# Patient Record
Sex: Male | Born: 1960 | Race: White | Hispanic: No | Marital: Married | Smoking: Never smoker
Health system: Southern US, Community
[De-identification: ages and names within clinical notes are randomized; demographics above are authoritative.]

## PROBLEM LIST (undated history)

## (undated) DIAGNOSIS — J45909 Unspecified asthma, uncomplicated: Secondary | ICD-10-CM

## (undated) DIAGNOSIS — F32A Depression, unspecified: Secondary | ICD-10-CM

## (undated) DIAGNOSIS — H409 Unspecified glaucoma: Secondary | ICD-10-CM

## (undated) HISTORY — DX: Unspecified glaucoma: H40.9

## (undated) HISTORY — DX: Unspecified asthma, uncomplicated: J45.909

## (undated) HISTORY — DX: Depression, unspecified: F32.A

## (undated) HISTORY — PX: SPINE SURGERY: SHX786

---

## 2016-09-28 DIAGNOSIS — S99921S Unspecified injury of right foot, sequela: Secondary | ICD-10-CM | POA: Insufficient documentation

## 2016-09-28 DIAGNOSIS — M7741 Metatarsalgia, right foot: Secondary | ICD-10-CM | POA: Insufficient documentation

## 2017-09-29 LAB — CBC AND DIFFERENTIAL
HCT: 43 (ref 41–53)
Hemoglobin: 14.4 (ref 13.5–17.5)
Platelets: 230 (ref 150–399)
WBC: 6

## 2017-09-29 LAB — LIPID PANEL
Cholesterol: 220 — AB (ref 0–200)
HDL: 47 (ref 35–70)
LDL Cholesterol: 150
Triglycerides: 117 (ref 40–160)

## 2017-09-29 LAB — BASIC METABOLIC PANEL
BUN: 18 (ref 4–21)
Creatinine: 0.7 (ref <=1.3)
Glucose: 100
Potassium: 4.2 (ref 3.4–5.3)
Sodium: 140 (ref 137–147)

## 2017-09-29 LAB — HEPATIC FUNCTION PANEL
ALT: 28 (ref 10–40)
AST: 28 (ref 14–40)
Alkaline Phosphatase: 66 (ref 25–125)
Bilirubin, Total: 0.5

## 2017-09-29 LAB — TSH: TSH: 0.98 (ref <=5.90)

## 2017-09-29 LAB — PSA: PSA: 3.5

## 2017-11-20 LAB — HM COLONOSCOPY

## 2018-08-23 DIAGNOSIS — H2513 Age-related nuclear cataract, bilateral: Secondary | ICD-10-CM | POA: Diagnosis not present

## 2018-08-23 DIAGNOSIS — H35362 Drusen (degenerative) of macula, left eye: Secondary | ICD-10-CM | POA: Diagnosis not present

## 2018-08-23 DIAGNOSIS — I7090 Unspecified atherosclerosis: Secondary | ICD-10-CM | POA: Diagnosis not present

## 2018-08-23 DIAGNOSIS — H401131 Primary open-angle glaucoma, bilateral, mild stage: Secondary | ICD-10-CM | POA: Diagnosis not present

## 2018-10-27 ENCOUNTER — Encounter: Payer: Self-pay | Admitting: Adult Health

## 2018-10-27 ENCOUNTER — Ambulatory Visit (INDEPENDENT_AMBULATORY_CARE_PROVIDER_SITE_OTHER): Payer: BLUE CROSS/BLUE SHIELD | Admitting: Adult Health

## 2018-10-27 ENCOUNTER — Other Ambulatory Visit: Payer: Self-pay

## 2018-10-27 DIAGNOSIS — H409 Unspecified glaucoma: Secondary | ICD-10-CM

## 2018-10-27 DIAGNOSIS — L719 Rosacea, unspecified: Secondary | ICD-10-CM

## 2018-10-27 DIAGNOSIS — Z0000 Encounter for general adult medical examination without abnormal findings: Secondary | ICD-10-CM

## 2018-10-27 MED ORDER — METRONIDAZOLE 0.75 % EX GEL: 1.0000 "application " | Freq: Two times a day (BID) | CUTANEOUS | 2 refills | Status: AC

## 2018-10-27 NOTE — Assessment & Plan Note (Signed)
Glaucoma, dx'd 2019- followed by local Ophthalmologist Q3M, next OV 22 November 2018

## 2018-10-27 NOTE — Progress Notes (Signed)
Virtual Visit via Video Note  I connected with Allen Castillo on 10/27/18 at  2:30 PM EDT by a video enabled telemedicine application and verified that I am speaking with the correct person using two identifiers.  Location: Patient: Home Provider: In Clinic    I discussed the limitations of evaluation and management by telemedicine and the availability of in person appointments. The patient expressed understanding and agreed to proceed.  History of Present Illness: Mr. Allen Castillo calls in today to establish as a new pt.  He is a pleasant 58 year old male. [id number]- treated with metronidazole 0.75% gel,  Glaucoma, dx'd 2019- followed by local Ophthalmologist Q3M, next OV 22 November 2018 He denies acute change in vision. He reports regular exercise- daily walking >1.82miles/day, home exercise program- "[employer]". He eliminated soda and fast food >9 months ago He reports following heart healthy diet He denies tobacco/vape/ETOH use He estimates to drink >100 oz water/day He reports sound sleep He denies acute issues today  Review of Systems: General:   No F/C, wt loss Pulm:   No DIB, SOB, pleuritic chest pain Card:  No CP, palpitations Abd:  No n/v/d or pain Ext:  No inc edema from baseline This patient does not have sx concerning for COVID-19 Infection (ie; fever, chills, cough, new or worsening shortness of breath).   Observations/Objective: No acute distress noted during telephone conversation  Assessment and Plan: Continue regular exercise and heart healthy diet Continue f/u with Ophthalmologist   COVID-19 Education: Signs and symptoms of COVID-19 infection were discussed with pt and how to seek care for testing.  The importance of following the Stay at Home order, and when out- Social Distancing and wearing a facial mask were discussed today.  Follow Up Instructions: CPE in 3 months, fasting labs the week prior    I discussed the assessment and treatment plan with the  patient. The patient was provided an opportunity to ask questions and all were answered. The patient agreed with the plan and demonstrated an understanding of the instructions.   The patient was advised to call back or seek an in-person evaluation if the symptoms worsen or if the condition fails to improve as anticipated.  I provided 18 minutes of non-face-to-face time during this encounter.   Julaine Fusi, NP

## 2018-10-27 NOTE — Assessment & Plan Note (Signed)
Metronidazole 0.75% gel refill sent in

## 2018-10-27 NOTE — Assessment & Plan Note (Signed)
Assessment and Plan: Continue regular exercise and heart healthy diet Continue f/u with Ophthalmologist   COVID-19 Education: Signs and symptoms of COVID-19 infection were discussed with pt and how to seek care for testing.  The importance of following the Stay at Home order, and when out- Social Distancing and wearing a facial mask were discussed today.  Follow Up Instructions: CPE in 3 months, fasting labs the week prior    I discussed the assessment and treatment plan with the patient. The patient was provided an opportunity to ask questions and all were answered. The patient agreed with the plan and demonstrated an understanding of the instructions.   The patient was advised to call back or seek an in-person evaluation if the symptoms worsen or if the condition fails to improve as anticipated.

## 2018-11-22 DIAGNOSIS — H04123 Dry eye syndrome of bilateral lacrimal glands: Secondary | ICD-10-CM | POA: Diagnosis not present

## 2018-11-22 DIAGNOSIS — H1851 Endothelial corneal dystrophy: Secondary | ICD-10-CM | POA: Diagnosis not present

## 2018-11-22 DIAGNOSIS — H401131 Primary open-angle glaucoma, bilateral, mild stage: Secondary | ICD-10-CM | POA: Diagnosis not present

## 2019-02-14 ENCOUNTER — Other Ambulatory Visit: Payer: Self-pay

## 2019-02-14 DIAGNOSIS — Z0000 Encounter for general adult medical examination without abnormal findings: Secondary | ICD-10-CM | POA: Diagnosis not present

## 2019-02-15 LAB — COMPREHENSIVE METABOLIC PANEL
ALT: 27 IU/L (ref 0–44)
AST: 21 IU/L (ref 0–40)
Albumin/Globulin Ratio: 1.5 (ref 1.2–2.2)
Albumin: 4 g/dL (ref 3.8–4.9)
Alkaline Phosphatase: 84 IU/L (ref 39–117)
BUN/Creatinine Ratio: 14 (ref 9–20)
BUN: 14 mg/dL (ref 6–24)
Bilirubin Total: 0.4 mg/dL (ref 0.0–1.2)
CO2: 24 mmol/L (ref 20–29)
Calcium: 9.4 mg/dL (ref 8.7–10.2)
Chloride: 105 mmol/L (ref 96–106)
Creatinine, Ser: 0.97 mg/dL (ref 0.76–1.27)
GFR calc Af Amer: 98 mL/min/{1.73_m2} (ref >59)
GFR calc non Af Amer: 85 mL/min/{1.73_m2} (ref >59)
Globulin, Total: 2.6 g/dL (ref 1.5–4.5)
Glucose: 103 mg/dL — ABNORMAL HIGH (ref 65–99)
Potassium: 4.7 mmol/L (ref 3.5–5.2)
Sodium: 140 mmol/L (ref 134–144)
Total Protein: 6.6 g/dL (ref 6.0–8.5)

## 2019-02-15 LAB — LIPID PANEL
Chol/HDL Ratio: 5.2 ratio — ABNORMAL HIGH (ref 0.0–5.0)
Cholesterol, Total: 202 mg/dL — ABNORMAL HIGH (ref 100–199)
HDL: 39 mg/dL — ABNORMAL LOW (ref >39)
LDL Calculated: 124 mg/dL — ABNORMAL HIGH (ref 0–99)
Triglycerides: 193 mg/dL — ABNORMAL HIGH (ref 0–149)
VLDL Cholesterol Cal: 39 mg/dL (ref 5–40)

## 2019-02-15 LAB — CBC WITH DIFFERENTIAL/PLATELET
Basophils Absolute: 0.1 10*3/uL (ref 0.0–0.2)
Basos: 1 %
EOS (ABSOLUTE): 0.2 10*3/uL (ref 0.0–0.4)
Eos: 3 %
Hematocrit: 41.6 % (ref 37.5–51.0)
Hemoglobin: 14.4 g/dL (ref 13.0–17.7)
Immature Grans (Abs): 0 10*3/uL (ref 0.0–0.1)
Immature Granulocytes: 0 %
Lymphocytes Absolute: 1.6 10*3/uL (ref 0.7–3.1)
Lymphs: 28 %
MCH: 29.6 pg (ref 26.6–33.0)
MCHC: 34.6 g/dL (ref 31.5–35.7)
MCV: 86 fL (ref 79–97)
Monocytes Absolute: 0.4 10*3/uL (ref 0.1–0.9)
Monocytes: 7 %
Neutrophils Absolute: 3.5 10*3/uL (ref 1.4–7.0)
Neutrophils: 61 %
Platelets: 226 10*3/uL (ref 150–450)
RBC: 4.86 x10E6/uL (ref 4.14–5.80)
RDW: 12.1 % (ref 11.6–15.4)
WBC: 5.8 10*3/uL (ref 3.4–10.8)

## 2019-02-15 LAB — HEMOGLOBIN A1C
Est. average glucose Bld gHb Est-mCnc: 103 mg/dL
Hgb A1c MFr Bld: 5.2 % (ref 4.8–5.6)

## 2019-02-15 LAB — TSH: TSH: 0.91 u[IU]/mL (ref 0.450–4.500)

## 2019-02-21 NOTE — Progress Notes (Signed)
Subjective:    Patient ID: Allen Castillo, male    DOB: 01/04/1961, 58 y.o.   MRN: [id number]  HPI:  10/27/2018 OV: Allen Castillo calls in today to establish as a new pt.  He is a pleasant 57 year old male. [id number]- treated with metronidazole 0.75% gel,  Glaucoma, dx'd 2019- followed by local Ophthalmologist Q3M, next OV 22 November 2018 He denies acute change in vision. He reports regular exercise- daily walking >1.53miles/day, home exercise program- "[employer]". He eliminated soda and fast food >9 months ago He reports following heart healthy diet He denies tobacco/vape/ETOH use He estimates to drink >100 oz water/day He reports sound sleep He denies acute issues today 02/22/2019 OV: Allen Castillo is here for CPE He continues to exercise regularly and drinks >100 oz water/day He denies every being told about elevated BP First reading 151/85, HR 58 Repeat 145/86 He denies tobacco/vape/ETOH use He denies CP/chest tightness with exertion  Reviewed 02/14/2019 Labs- TSH-WNL, 0.910  A1c-WNL, 5.2  CMP-stable  CBC-stable  Lipid Panel-  Tot-202  TGs-193  HDL-39  LDL-124  ASCVD Risk 12.7%, opt 5.2%  Healthcare Maintenance: Colonoscopy-UTD, last 11/20/2017- repeat in 5 years Immunizations-declined zoster   Patient Care Team    Relationship Specialty Notifications Start End  Julaine Fusi, NP PCP - General Family Medicine  10/25/18     Patient Active Problem List   Diagnosis Date Noted  . Elevated LDL cholesterol level 02/22/2019  . Rosacea 10/27/2018  . Glaucoma 10/27/2018  . Healthcare maintenance 10/27/2018     Past Medical History:  Diagnosis Date  . Glaucoma      Past Surgical History:  Procedure Laterality Date  . SPINE SURGERY       Family History  Problem Relation Age of Onset  . Healthy Mother   . Healthy Father   . Healthy Brother      Social History   Substance and Sexual Activity  Drug Use Never     Social History   Substance and Sexual  Activity  Alcohol Use Never  . Frequency: Never     Social History   Tobacco Use  Smoking Status Never Smoker  Smokeless Tobacco Never Used     Outpatient Encounter Medications as of 02/22/2019  Medication Sig  . latanoprost (XALATAN) 0.005 % ophthalmic solution Place 1 drop into both eyes daily.  . metroNIDAZOLE (METROGEL) 0.75 % gel Apply 1 application topically 2 (two) times daily.  . Omega-3 Fatty Acids (FISH OIL BURP-LESS PO) Take 1 capsule by mouth daily.  . saw palmetto 500 MG capsule Take 500 mg by mouth daily.  . timolol (TIMOPTIC-XR) 0.5 % ophthalmic gel-forming Place 1 drop into both eyes daily.   No facility-administered encounter medications on file as of 02/22/2019.     Allergies: Patient has no known allergies.  Body mass index is 28.15 kg/m.  Blood pressure (!) 145/86, pulse (!) 58, temperature 98.5 F (36.9 C), temperature source Oral, height 5' 9.75" (1.772 m), weight 194 lb 12.8 oz (88.4 kg), SpO2 97 %.  Review of Systems  Constitutional: Positive for fatigue. Negative for activity change, appetite change, chills, diaphoresis, fever and unexpected weight change.  HENT: Negative for congestion.   Eyes: Negative for visual disturbance.  Respiratory: Negative for cough, chest tightness, shortness of breath, wheezing and stridor.   Cardiovascular: Negative for chest pain, palpitations and leg swelling.  Gastrointestinal: Negative for abdominal distention, anal bleeding, blood in stool, constipation, diarrhea, nausea and vomiting.  Endocrine: Negative  for cold intolerance, heat intolerance, polydipsia, polyphagia and polyuria.  Genitourinary: Negative for difficulty urinating and flank pain.  Musculoskeletal: Positive for arthralgias. Negative for back pain, gait problem, joint swelling, myalgias, neck pain and neck stiffness.       Painful, swollen 4th R toe   Skin: Negative for color change, pallor, rash and wound.  Neurological: Negative for dizziness and  headaches.  Hematological: Negative for adenopathy. Does not bruise/bleed easily.  Psychiatric/Behavioral: Negative for agitation, behavioral problems, confusion, decreased concentration, dysphoric mood, hallucinations, self-injury, sleep disturbance and suicidal ideas. The patient is not nervous/anxious and is not hyperactive.        Objective:   Physical Exam Vitals signs and nursing note reviewed.  HENT:     Head: Normocephalic and atraumatic.     Right Ear: Tympanic membrane, ear canal and external ear normal. There is no impacted cerumen.     Left Ear: Tympanic membrane, ear canal and external ear normal. There is no impacted cerumen.     Nose: Nose normal. No congestion.     Mouth/Throat:     Mouth: Mucous membranes are moist.  Eyes:     Extraocular Movements: Extraocular movements intact.     Pupils: Pupils are equal, round, and reactive to light.  Neck:     Musculoskeletal: Normal range of motion and neck supple.  Cardiovascular:     Rate and Rhythm: Normal rate and regular rhythm.     Pulses: Normal pulses.     Heart sounds: Normal heart sounds. No murmur. No friction rub. No gallop.   Pulmonary:     Effort: Pulmonary effort is normal. No respiratory distress.     Breath sounds: Normal breath sounds. No stridor. No wheezing, rhonchi or rales.  Chest:     Chest wall: No tenderness.  Abdominal:     General: Abdomen is flat. Bowel sounds are normal. There is no distension.     Palpations: Abdomen is soft. There is no mass.     Tenderness: There is no abdominal tenderness. There is no right CVA tenderness, left CVA tenderness, guarding or rebound.     Hernia: No hernia is present.  Genitourinary:    Comments: Declined DRE/gential exam  Musculoskeletal:        General: Swelling present.     Right foot: Tenderness and swelling present.     Comments: R 4th toe- 1+ edema, slightly tender to the touch Brisk cap refill  No open tissue   Skin:    General: Skin is warm and  dry.     Capillary Refill: Capillary refill takes less than 2 seconds.  Neurological:     Mental Status: He is alert and oriented to person, place, and time.     Coordination: Coordination normal.  Psychiatric:        Mood and Affect: Mood normal.        Behavior: Behavior normal.        Thought Content: Thought content normal.        Judgment: Judgment normal.        Assessment & Plan:   1. Elevated LDL cholesterol level   2. Healthcare maintenance     Healthcare maintenance Cholesterol panel- Total- 202 (slightly elevated) Trig- 193 (high) HDL-39 (low) LDL- 124 (high) Your ASCVD risk is 12.7%, optimal is 5.2%- partially contributing to this is elevated blood pressure. Reduce saturated fat by at least 50% and continue regular exercise. Remain well hydrated. Follow-up 4 weeks, re: blood pressure check. Recommend  repeating cholesterol panel in 4 months and if not markedly improved discuss statin therapy. Colonoscopy report recommended repeat study in 5 years (ie 2024). Continue to social distance and wear a mask in public.  Elevated LDL cholesterol level Lipid Panel-  Tot-202  TGs-193  HDL-39  LDL-124  ASCVD Risk 12.7%, opt 5.2% Reduce saturated fat, continue regular exercise Re-check lipids in     FOLLOW-UP:  Return in about 4 weeks (around 03/22/2019) for Regular Follow Up, HTN.

## 2019-02-22 ENCOUNTER — Other Ambulatory Visit: Payer: Self-pay

## 2019-02-22 ENCOUNTER — Ambulatory Visit (INDEPENDENT_AMBULATORY_CARE_PROVIDER_SITE_OTHER): Payer: BC Managed Care – PPO | Admitting: Adult Health

## 2019-02-22 ENCOUNTER — Encounter: Payer: Self-pay | Admitting: Adult Health

## 2019-02-22 VITALS — BP 145/86 | HR 58 | Temp 98.5°F | Ht 69.75 in | Wt 194.8 lb

## 2019-02-22 DIAGNOSIS — Z0000 Encounter for general adult medical examination without abnormal findings: Secondary | ICD-10-CM

## 2019-02-22 DIAGNOSIS — E7800 Pure hypercholesterolemia, unspecified: Secondary | ICD-10-CM | POA: Diagnosis not present

## 2019-02-22 NOTE — Assessment & Plan Note (Signed)
Cholesterol panel- Total- 202 (slightly elevated) Trig- 193 (high) HDL-39 (low) LDL- 124 (high) Your ASCVD risk is 12.7%, optimal is 5.2%- partially contributing to this is elevated blood pressure. Reduce saturated fat by at least 50% and continue regular exercise. Remain well hydrated. Follow-up 4 weeks, re: blood pressure check. Recommend repeating cholesterol panel in 4 months and if not markedly improved discuss statin therapy. Colonoscopy report recommended repeat study in 5 years (ie 2024). Continue to social distance and wear a mask in public.

## 2019-02-22 NOTE — Patient Instructions (Addendum)

## 2019-02-22 NOTE — Assessment & Plan Note (Addendum)
Lipid Panel-  Tot-202  TGs-193  HDL-39  LDL-124  ASCVD Risk 12.7%, opt 5.2% Reduce saturated fat, continue regular exercise Re-check lipids in 4 months

## 2019-03-11 ENCOUNTER — Other Ambulatory Visit: Payer: Self-pay

## 2019-03-11 ENCOUNTER — Ambulatory Visit (INDEPENDENT_AMBULATORY_CARE_PROVIDER_SITE_OTHER): Payer: BC Managed Care – PPO

## 2019-03-11 ENCOUNTER — Other Ambulatory Visit: Payer: Self-pay | Admitting: Podiatry

## 2019-03-11 ENCOUNTER — Encounter: Payer: Self-pay | Admitting: Podiatry

## 2019-03-11 ENCOUNTER — Ambulatory Visit: Payer: BC Managed Care – PPO | Admitting: Podiatry

## 2019-03-11 VITALS — BP 126/77 | HR 63 | Resp 16

## 2019-03-11 DIAGNOSIS — M7751 Other enthesopathy of right foot: Secondary | ICD-10-CM

## 2019-03-11 DIAGNOSIS — M722 Plantar fascial fibromatosis: Secondary | ICD-10-CM

## 2019-03-11 DIAGNOSIS — M778 Other enthesopathies, not elsewhere classified: Secondary | ICD-10-CM

## 2019-03-11 NOTE — Patient Instructions (Signed)

## 2019-03-21 NOTE — Progress Notes (Signed)
Subjective:    Patient ID: Allen Castillo, male    DOB: 11-17-1960, 58 y.o.   MRN: [id number]  HPI:  10/27/2018 OV: Allen Castillo calls in today to establish as a new pt. He is a pleasant 58 year old male. [id number]- treated with metronidazole 0.75% gel, Glaucoma, dx'd 2019- followed by local Ophthalmologist Q3M, next OV 22 November 2018 He denies acute change in vision. He reports regular exercise- daily walking >1.71miles/day, home exercise program- "[employer]". He eliminated soda and fast food >9 months ago He reports following heart healthy diet He denies tobacco/vape/ETOH use He estimates to drink >100 oz water/day He reports sound sleep He denies acute issues today 02/22/2019 OV: Allen Castillo is here for CPE He continues to exercise regularly and drinks >100 oz water/day He denies every being told about elevated BP First reading 151/85, HR 58 Repeat 145/86 He denies tobacco/vape/ETOH use He denies CP/chest tightness with exertion  Reviewed 02/14/2019 Labs- TSH-WNL, 0.910  A1c-WNL, 5.2  CMP-stable  CBC-stable  Lipid Panel-  Tot-202  TGs-193  HDL-39  LDL-124  ASCVD Risk 12.7%, opt 5.2%  Healthcare Maintenance: Colonoscopy-UTD, last 11/20/2017- repeat in 5 years Immunizations-declined zoster  03/21/2019 OV: Allen Castillo is here for 4 week f/u: Elevated BP BP today 118/71, HR 66 BP at podiatry appt on 03/11/2019- 126/77, HR 63 Cholesterol panel 02/14/2019- Total- 202 (slightly elevated) Trig- 193 (high) HDL-39 (low) LDL- 124 (high) Your ASCVD risk is 12.7%, optimal is 5.2%- partially contributing to this is elevated blood pressure. Reduce saturated fat by at least 50% and continue regular exercise. Will re-check Lipids in 3 months  He continues to exercise regularly and has really made concerted effort to reduce red meat in his diet He denies acute cardiac sx's  Patient Care Team    Relationship Specialty Notifications Start End  Esaw Grandchild, NP PCP - General  Family Medicine  10/25/18     Patient Active Problem List   Diagnosis Date Noted  . Elevated BP without diagnosis of hypertension 03/22/2019  . Elevated LDL cholesterol level 02/22/2019  . Rosacea 10/27/2018  . Glaucoma 10/27/2018  . Healthcare maintenance 10/27/2018     Past Medical History:  Diagnosis Date  . Glaucoma      Past Surgical History:  Procedure Laterality Date  . SPINE SURGERY       Family History  Problem Relation Age of Onset  . Healthy Mother   . Healthy Father   . Healthy Brother      Social History   Substance and Sexual Activity  Drug Use Never     Social History   Substance and Sexual Activity  Alcohol Use Never  . Frequency: Never     Social History   Tobacco Use  Smoking Status Never Smoker  Smokeless Tobacco Never Used     Outpatient Encounter Medications as of 03/22/2019  Medication Sig  . latanoprost (XALATAN) 0.005 % ophthalmic solution Place 1 drop into both eyes daily.  . metroNIDAZOLE (METROGEL) 0.75 % gel Apply 1 application topically 2 (two) times daily.  . Omega-3 Fatty Acids (FISH OIL BURP-LESS PO) Take 1 capsule by mouth daily.  . saw palmetto 500 MG capsule Take 500 mg by mouth daily.  . timolol (TIMOPTIC) 0.5 % ophthalmic solution    No facility-administered encounter medications on file as of 03/22/2019.     Allergies: Patient has no known allergies.  Body mass index is 27.89 kg/m.  Blood pressure 118/71, pulse 66, temperature 98.5 F (  36.9 C), temperature source Oral, height 5' 9.75" (1.772 m), weight 193 lb (87.5 kg), SpO2 98 %.  Review of Systems  Constitutional: Negative for activity change, appetite change, chills, diaphoresis, fatigue, fever and unexpected weight change.  Eyes: Negative for visual disturbance.  Respiratory: Negative for cough, chest tightness, shortness of breath, wheezing and stridor.   Cardiovascular: Negative for chest pain, palpitations and leg swelling.  Endocrine: Negative  for cold intolerance, heat intolerance, polydipsia, polyphagia and polyuria.  Genitourinary: Negative for difficulty urinating.  Neurological: Negative for dizziness and headaches.  Hematological: Negative for adenopathy. Does not bruise/bleed easily.       Objective:   Physical Exam Vitals signs and nursing note reviewed.  Constitutional:      General: He is not in acute distress.    Appearance: Normal appearance. He is normal weight. He is not ill-appearing, toxic-appearing or diaphoretic.  HENT:     Head: Normocephalic and atraumatic.  Eyes:     Extraocular Movements: Extraocular movements intact.     Conjunctiva/sclera: Conjunctivae normal.     Pupils: Pupils are equal, round, and reactive to light.  Cardiovascular:     Rate and Rhythm: Normal rate and regular rhythm.     Pulses: Normal pulses.     Heart sounds: Normal heart sounds. No murmur. No friction rub. No gallop.   Pulmonary:     Effort: Pulmonary effort is normal. No respiratory distress.     Breath sounds: Normal breath sounds. No stridor. No wheezing, rhonchi or rales.  Chest:     Chest wall: No tenderness.  Skin:    General: Skin is warm and dry.     Capillary Refill: Capillary refill takes less than 2 seconds.  Neurological:     Mental Status: He is alert and oriented to person, place, and time.  Psychiatric:        Mood and Affect: Mood normal.        Behavior: Behavior normal.        Thought Content: Thought content normal.        Judgment: Judgment normal.       Assessment & Plan:   1. Elevated BP without diagnosis of hypertension     Elevated BP without diagnosis of hypertension Your blood pressure here and at podiatry appt. On 03/11/2019 are both excellent. Continue regular exercise, remain well hydrated, and follow Mediterranean diet- continue to reduce saturated fat. Please schedule fasting lab appt in 3 months, re: check cholesterol panel. Continue with Podiatry as directed. Continue to  social distance and wear a mask when in public. Annual physical Sept 2021.    FOLLOW-UP:  Return in about 3 months (around 06/21/2019) for Fasting Labs.

## 2019-03-22 ENCOUNTER — Ambulatory Visit: Payer: BC Managed Care – PPO | Admitting: Adult Health

## 2019-03-22 ENCOUNTER — Other Ambulatory Visit: Payer: Self-pay

## 2019-03-22 ENCOUNTER — Encounter: Payer: Self-pay | Admitting: Adult Health

## 2019-03-22 DIAGNOSIS — R030 Elevated blood-pressure reading, without diagnosis of hypertension: Secondary | ICD-10-CM | POA: Diagnosis not present

## 2019-03-22 NOTE — Assessment & Plan Note (Signed)
Your blood pressure here and at podiatry appt. On 03/11/2019 are both excellent. Continue regular exercise, remain well hydrated, and follow Mediterranean diet- continue to reduce saturated fat. Please schedule fasting lab appt in 3 months, re: check cholesterol panel. Continue with Podiatry as directed. Continue to social distance and wear a mask when in public. Annual physical Sept 2021.

## 2019-03-22 NOTE — Patient Instructions (Addendum)
Mediterranean Diet A Mediterranean diet refers to food and lifestyle choices that are based on the traditions of countries located on the [employer]. This way of eating has been shown to help prevent certain conditions and improve outcomes for people who have chronic diseases, like kidney disease and heart disease. What are tips for following this plan? Lifestyle  Cook and eat meals together with your family, when possible.  Drink enough fluid to keep your urine clear or pale yellow.  Be physically active every day. This includes: ? Aerobic exercise like running or swimming. ? Leisure activities like gardening, walking, or housework.  Get 7-8 hours of sleep each night.  If recommended by your health care provider, drink red wine in moderation. This means 1 glass a day for nonpregnant women and 2 glasses a day for men. A glass of wine equals 5 oz (150 mL). Reading food labels   Check the serving size of packaged foods. For foods such as rice and pasta, the serving size refers to the amount of cooked product, not dry.  Check the total fat in packaged foods. Avoid foods that have saturated fat or trans fats.  Check the ingredients list for added sugars, such as corn syrup. Shopping  At the grocery store, buy most of your food from the areas near the walls of the store. This includes: ? Fresh fruits and vegetables (produce). ? Grains, beans, nuts, and seeds. Some of these may be available in unpackaged forms or large amounts (in bulk). ? Fresh seafood. ? Poultry and eggs. ? Low-fat dairy products.  Buy whole ingredients instead of prepackaged foods.  Buy fresh fruits and vegetables in-season from local farmers markets.  Buy frozen fruits and vegetables in resealable bags.  If you do not have access to quality fresh seafood, buy precooked frozen shrimp or canned fish, such as tuna, salmon, or sardines.  Buy small amounts of raw or cooked vegetables, salads, or olives from  the deli or salad bar at your store.  Stock your pantry so you always have certain foods on hand, such as olive oil, canned tuna, canned tomatoes, rice, pasta, and beans. Cooking  Cook foods with extra-virgin olive oil instead of using butter or other vegetable oils.  Have meat as a side dish, and have vegetables or grains as your main dish. This means having meat in small portions or adding small amounts of meat to foods like pasta or stew.  Use beans or vegetables instead of meat in common dishes like chili or lasagna.  Experiment with different cooking methods. Try roasting or broiling vegetables instead of steaming or sauteing them.  Add frozen vegetables to soups, stews, pasta, or rice.  Add nuts or seeds for added healthy fat at each meal. You can add these to yogurt, salads, or vegetable dishes.  Marinate fish or vegetables using olive oil, lemon juice, garlic, and fresh herbs. Meal planning   Plan to eat 1 vegetarian meal one day each week. Try to work up to 2 vegetarian meals, if possible.  Eat seafood 2 or more times a week.  Have healthy snacks readily available, such as: ? Vegetable sticks with hummus. ? [place] yogurt. ? Fruit and nut trail mix.  Eat balanced meals throughout the week. This includes: ? Fruit: 2-3 servings a day ? Vegetables: 4-5 servings a day ? Low-fat dairy: 2 servings a day ? Fish, poultry, or lean meat: 1 serving a day ? Beans and legumes: 2 or more servings a week ?  Nuts and seeds: 1-2 servings a day ? Whole grains: 6-8 servings a day ? Extra-virgin olive oil: 3-4 servings a day  Limit red meat and sweets to only a few servings a month What are my food choices?  Mediterranean diet ? Recommended  Grains: Whole-grain pasta. Brown rice. Bulgar wheat. Polenta. Couscous. Whole-wheat bread. [place].  Vegetables: Artichokes. Beets. Broccoli. Cabbage. Carrots. Eggplant. Green beans. Chard. Kale. Spinach. Onions. Leeks. Peas. Squash.  Tomatoes. Peppers. Radishes.  Fruits: Apples. Apricots. Avocado. Berries. Bananas. Cherries. Dates. Figs. Grapes. Lemons. Melon. Oranges. Peaches. Plums. Pomegranate.  Meats and other protein foods: Beans. Almonds. Sunflower seeds. Pine nuts. Peanuts. [place]. Salmon. Scallops. Shrimp. [place]. Tilapia. Clams. Oysters. Eggs.  Dairy: Low-fat milk. Cheese. Greek yogurt.  Beverages: Water. Red wine. Herbal tea.  Fats and oils: Extra virgin olive oil. Avocado oil. Grape seed oil.  Sweets and desserts: [place] yogurt with honey. Baked apples. Poached pears. Trail mix.  Seasoning and other foods: Basil. Cilantro. Coriander. Cumin. Mint. Parsley. Sage. Rosemary. Tarragon. Garlic. Oregano. Thyme. Pepper. Balsalmic vinegar. Tahini. Hummus. Tomato sauce. Olives. Mushrooms. ? Limit these  Grains: Prepackaged pasta or rice dishes. Prepackaged cereal with added sugar.  Vegetables: Deep fried potatoes (french fries).  Fruits: Fruit canned in syrup.  Meats and other protein foods: Beef. Pork. Lamb. Poultry with skin. Hot dogs. Berniece Salines.  Dairy: Ice cream. Sour cream. Whole milk.  Beverages: Juice. Sugar-sweetened soft drinks. Beer. Liquor and spirits.  Fats and oils: Butter. Canola oil. Vegetable oil. Beef fat (tallow). Lard.  Sweets and desserts: Cookies. Cakes. Pies. Candy.  Seasoning and other foods: Mayonnaise. Premade sauces and marinades. The items listed may not be a complete list. Talk with your dietitian about what dietary choices are right for you. Summary  The Mediterranean diet includes both food and lifestyle choices.  Eat a variety of fresh fruits and vegetables, beans, nuts, seeds, and whole grains.  Limit the amount of red meat and sweets that you eat.  Talk with your health care provider about whether it is safe for you to drink red wine in moderation. This means 1 glass a day for nonpregnant women and 2 glasses a day for men. A glass of wine equals 5 oz (150 mL). This information  is not intended to replace advice given to you by your health care provider. Make sure you discuss any questions you have with your health care provider. Document Released: 01/31/2016 Document Revised: 02/07/2016 Document Reviewed: 01/31/2016 Elsevier Patient Education  2020 [place] blood pressure here and at podiatry appt. On 03/11/2019 are both excellent. Continue regular exercise, remain well hydrated, and follow Mediterranean diet- continue to reduce saturated fat. Please schedule fasting lab appt in 3 months, re: check cholesterol panel. Continue with Podiatry as directed. Continue to social distance and wear a mask when in public. Annual physical Sept 2021. GREAT TO SEE YOU!

## 2019-03-23 NOTE — Progress Notes (Signed)
Subjective:   Patient ID: Allen Castillo, male   DOB: 58 y.o.   MRN: [id number]   HPI 58 year old male presents the office today for concerns of pain to the right fourth toe.  He states his been swelling tender for the last few months he is unsure of any injury.  He states that sore pressure is applied.  He also states he has pain on both of his heels on the bottom of the left side worse than right.  He describes it as tender.  He states pain is worse towards the end of the day.  He is tried some over-the-counter cream for a significant improvement.  No recent injury for this issue.  No other concerns.   Review of Systems  All other systems reviewed and are negative.  Past Medical History:  Diagnosis Date  . Glaucoma     Past Surgical History:  Procedure Laterality Date  . SPINE SURGERY       Current Outpatient Medications:  .  latanoprost (XALATAN) 0.005 % ophthalmic solution, Place 1 drop into both eyes daily., Disp: , Rfl:  .  metroNIDAZOLE (METROGEL) 0.75 % gel, Apply 1 application topically 2 (two) times daily., Disp: 45 g, Rfl: 2 .  Omega-3 Fatty Acids (FISH OIL BURP-LESS PO), Take 1 capsule by mouth daily., Disp: , Rfl:  .  saw palmetto 500 MG capsule, Take 500 mg by mouth daily., Disp: , Rfl:  .  timolol (TIMOPTIC) 0.5 % ophthalmic solution, , Disp: , Rfl:   No Known Allergies       Objective:  Physical Exam  General: AAO x3, NAD  Dermatological: Skin is warm, dry and supple bilateral. Nails x 10 are well manicured; remaining integument appears unremarkable at this time. There are no open sores, no preulcerative lesions, no rash or signs of infection present.  Vascular: Dorsalis Pedis artery and Posterior Tibial artery pedal pulses are 2/4 bilateral with immedate capillary fill time. Pedal hair growth present. No varicosities and no lower extremity edema present bilateral. There is no pain with calf compression, swelling, warmth, erythema.   Neruologic: Grossly  intact via light touch bilateral. Protective threshold with Semmes Wienstein monofilament intact to all pedal sites bilateral.   Musculoskeletal: Tenderness to palpation along the plantar medial tubercle of the calcaneus at the insertion of plantar fascia on the left and right foot. There is no pain along the course of the plantar fascia within the arch of the foot. Plantar fascia appears to be intact. There is no pain with lateral compression of the calcaneus or pain with vibratory sensation. There is no pain along the course or insertion of the achilles tendon.  Mild tenderness palpation along the fourth digit mild swelling but there is no erythema or warmth.  No drainage or any open sores.  No other areas of tenderness to bilateral lower extremities.  Gait: Unassisted, Nonantalgic.       Assessment:   Bilateral heel pain, plan fasciitis of the right fourth toe swelling    Plan:  -Treatment options discussed including all alternatives, risks, and complications -Etiology of symptoms were discussed -X-rays were obtained and reviewed with the patient. No evidence of acute fracture or stress fracture is identified today. -Meloxicam. -Discussed stretching, icing daily. -Graphite inserts.   -Hopefully the mobic will help with the fourth toe swelling.  Return in about 4 weeks (around 04/08/2019).  Trula Slade DPM

## 2019-04-11 ENCOUNTER — Other Ambulatory Visit: Payer: Self-pay

## 2019-04-11 ENCOUNTER — Encounter: Payer: Self-pay | Admitting: Podiatry

## 2019-04-11 ENCOUNTER — Ambulatory Visit: Payer: BC Managed Care – PPO | Admitting: Podiatry

## 2019-04-11 DIAGNOSIS — M722 Plantar fascial fibromatosis: Secondary | ICD-10-CM | POA: Diagnosis not present

## 2019-04-11 DIAGNOSIS — M7751 Other enthesopathy of right foot: Secondary | ICD-10-CM | POA: Diagnosis not present

## 2019-04-11 MED ORDER — METHYLPREDNISOLONE 4 MG PO TBPK: ORAL_TABLET | ORAL | 0 refills | Status: DC

## 2019-04-11 NOTE — Patient Instructions (Signed)

## 2019-04-12 NOTE — Progress Notes (Signed)
Subjective: 58 year old male presents the office today for follow-up evaluation of bilateral heel pain that was right fourth toe pain.  He states the right foot first met the same however the heel pain has progressed to the right side worse than the left.  He still gets pain in the morning when he first gets up after being on his feet all day.  Pressure braces have been helpful but is not sure if he is wearing them correctly.Denies any systemic complaints such as fevers, chills, nausea, vomiting. No acute changes since last appointment, and no other complaints at this time.   Objective: AAO x3, NAD DP/PT pulses palpable bilaterally, CRT less than 3 seconds There is tenderness on the plantar medial tubercle of the calcaneus at insertion of plantar fascial bilaterally with right side worse than left.  No pain with lateral compression of calcaneus.  No pain with Achilles tendon.  Minimal discomfort in right foot.  Mild swelling but there is no erythema or warmth. No pain with calf compression, swelling, warmth, erythema  Assessment: Bilateral plantar fasciitis with right fourth toe pain  Plan: -All treatment options discussed with the patient including all alternatives, risks, complications.  -Steroid injection performed bilateral heels.  See procedure note below.  Continue stretching, icing daily.  Night splint. -Discussion modifications and orthotics.  Start an over-the-counter insert. -Medrol Dosepak ordered -Patient encouraged to call the office with any questions, concerns, change in symptoms.   Procedure: Injection Tendon/Ligament Discussed alternatives, risks, complications and verbal consent was obtained.  Location: Bilateral plantar fascia at the glabrous junction; medial approach. Skin Prep: Alcohol Injectate: 0.5cc 0.5% marcaine plain, 0.5 cc 2% lidocaine plain and, 1 cc kenalog 10. Disposition: Patient tolerated procedure well. Injection site dressed with a band-aid.  Post-injection  care was discussed and return precautions discussed.   Return in about 4 weeks (around 05/09/2019).  Trula Slade DPM

## 2019-04-25 DIAGNOSIS — H401131 Primary open-angle glaucoma, bilateral, mild stage: Secondary | ICD-10-CM | POA: Diagnosis not present

## 2019-05-10 ENCOUNTER — Ambulatory Visit (INDEPENDENT_AMBULATORY_CARE_PROVIDER_SITE_OTHER): Payer: BC Managed Care – PPO | Admitting: Podiatry

## 2019-05-10 ENCOUNTER — Other Ambulatory Visit: Payer: Self-pay

## 2019-05-10 DIAGNOSIS — M722 Plantar fascial fibromatosis: Secondary | ICD-10-CM | POA: Diagnosis not present

## 2019-05-15 DIAGNOSIS — M722 Plantar fascial fibromatosis: Secondary | ICD-10-CM | POA: Insufficient documentation

## 2019-05-15 NOTE — Progress Notes (Signed)
Subjective: 58 year old male presents the office today for evaluation of bilateral heel pain, plantar fasciitis.  He said the injection was helpful on the left foot than the right foot and he still gets discomfort on the right side.  He has still been stretching, icing and using the night splint as well.  No recent injury.  No numbness or tingling or radiating pain.  Denies any systemic complaints such as fevers, chills, nausea, vomiting. No acute changes since last appointment, and no other complaints at this time.   Objective: AAO x3, NAD DP/PT pulses palpable bilaterally, CRT less than 3 seconds Tenderness palpation on the plantar medial tubercle of the calcaneus at insertion of the plantar fascia on the right side worse than left.  No significant discomfort of the left side today.  There is no pain with lateral compression of the calcaneus.  Achilles tendon, plantar fascia appear to be intact.  Swelling to the fourth toe has resolved. No pain with calf compression, swelling, warmth, erythema  Assessment: Plantar fasciitis right side >> left  Plan: -All treatment options discussed with the patient including all alternatives, risks, complications.  -He wants to hold off on steroid injections today.  Myofascial taping was applied.  Also power steps were dispensed.  He has a cam boot at home and we discussed that he can use this as well.  Continue to ice and elevate as well stretching exercises daily.  If symptoms continue recommend an MRI/arthritic labs.  -Patient encouraged to call the office with any questions, concerns, change in symptoms.   Trula Slade DPM

## 2019-06-07 ENCOUNTER — Ambulatory Visit: Payer: BC Managed Care – PPO | Admitting: Podiatry

## 2019-06-28 ENCOUNTER — Other Ambulatory Visit: Payer: Self-pay

## 2019-06-28 ENCOUNTER — Other Ambulatory Visit: Payer: 59

## 2019-06-28 DIAGNOSIS — E7800 Pure hypercholesterolemia, unspecified: Secondary | ICD-10-CM

## 2019-06-29 ENCOUNTER — Other Ambulatory Visit: Payer: Self-pay | Admitting: Adult Health

## 2019-06-29 LAB — LIPID PANEL
Chol/HDL Ratio: 5.4 ratio — ABNORMAL HIGH (ref 0.0–5.0)
Cholesterol, Total: 226 mg/dL — ABNORMAL HIGH (ref 100–199)
HDL: 42 mg/dL (ref >39)
LDL Chol Calc (NIH): 147 mg/dL — ABNORMAL HIGH (ref 0–99)
Triglycerides: 202 mg/dL — ABNORMAL HIGH (ref 0–149)
VLDL Cholesterol Cal: 37 mg/dL (ref 5–40)

## 2019-06-29 MED ORDER — ATORVASTATIN CALCIUM 10 MG PO TABS: 10.0000 mg | ORAL_TABLET | Freq: Every day | ORAL | 0 refills | Status: DC

## 2019-08-04 ENCOUNTER — Other Ambulatory Visit: Payer: Self-pay

## 2019-08-04 DIAGNOSIS — E7800 Pure hypercholesterolemia, unspecified: Secondary | ICD-10-CM

## 2019-08-04 DIAGNOSIS — Z79899 Other long term (current) drug therapy: Secondary | ICD-10-CM

## 2019-08-15 ENCOUNTER — Other Ambulatory Visit: Payer: 59

## 2019-08-15 ENCOUNTER — Other Ambulatory Visit: Payer: Self-pay

## 2019-08-15 DIAGNOSIS — E7800 Pure hypercholesterolemia, unspecified: Secondary | ICD-10-CM

## 2019-08-15 DIAGNOSIS — Z79899 Other long term (current) drug therapy: Secondary | ICD-10-CM

## 2019-08-16 LAB — HEPATIC FUNCTION PANEL
ALT: 26 IU/L (ref 0–44)
AST: 23 IU/L (ref 0–40)
Albumin: 4 g/dL (ref 3.8–4.9)
Alkaline Phosphatase: 77 IU/L (ref 39–117)
Bilirubin Total: 0.5 mg/dL (ref 0.0–1.2)
Bilirubin, Direct: 0.13 mg/dL (ref 0.00–0.40)
Total Protein: 6.7 g/dL (ref 6.0–8.5)

## 2019-08-16 LAB — LIPID PANEL
Chol/HDL Ratio: 4 ratio (ref 0.0–5.0)
Cholesterol, Total: 153 mg/dL (ref 100–199)
HDL: 38 mg/dL — ABNORMAL LOW (ref >39)
LDL Chol Calc (NIH): 84 mg/dL (ref 0–99)
Triglycerides: 179 mg/dL — ABNORMAL HIGH (ref 0–149)
VLDL Cholesterol Cal: 31 mg/dL (ref 5–40)

## 2019-10-04 ENCOUNTER — Ambulatory Visit (INDEPENDENT_AMBULATORY_CARE_PROVIDER_SITE_OTHER): Payer: 59 | Admitting: Family Medicine

## 2019-10-04 ENCOUNTER — Other Ambulatory Visit: Payer: Self-pay

## 2019-10-04 ENCOUNTER — Encounter: Payer: Self-pay | Admitting: Family Medicine

## 2019-10-04 VITALS — Ht 71.0 in | Wt 185.0 lb

## 2019-10-04 DIAGNOSIS — J309 Allergic rhinitis, unspecified: Secondary | ICD-10-CM

## 2019-10-04 DIAGNOSIS — J3089 Other allergic rhinitis: Secondary | ICD-10-CM

## 2019-10-04 DIAGNOSIS — J019 Acute sinusitis, unspecified: Secondary | ICD-10-CM | POA: Diagnosis not present

## 2019-10-04 DIAGNOSIS — H1013 Acute atopic conjunctivitis, bilateral: Secondary | ICD-10-CM | POA: Diagnosis not present

## 2019-10-04 MED ORDER — OLOPATADINE HCL 0.2 % OP SOLN: OPHTHALMIC | 0 refills | Status: AC

## 2019-10-04 MED ORDER — PREDNISONE 10 MG PO TABS: 10.0000 mg | ORAL_TABLET | Freq: Every day | ORAL | 0 refills | Status: AC

## 2019-10-04 MED ORDER — FLUTICASONE PROPIONATE 50 MCG/ACT NA SUSP: NASAL | 2 refills | Status: AC

## 2019-10-04 MED ORDER — LEVOCETIRIZINE DIHYDROCHLORIDE 5 MG PO TABS: 5.0000 mg | ORAL_TABLET | Freq: Every evening | ORAL | 0 refills | Status: AC

## 2019-10-04 MED ORDER — MONTELUKAST SODIUM 10 MG PO TABS: 10.0000 mg | ORAL_TABLET | Freq: Every day | ORAL | 0 refills | Status: AC

## 2019-10-04 NOTE — Progress Notes (Signed)
Telehealth office visit note for Allen Castillo, D.O- at Primary Care at [facility]   I connected with current patient today and verified that I am speaking with the correct person   . Location of the patient: Home . Location of the provider: Office - This visit type was conducted due to national recommendations for restrictions regarding the COVID-19 Pandemic (e.g. social distancing) in an effort to limit this patient's exposure and mitigate transmission in our community.    - No physical exam could be performed with this format, beyond that communicated to [place] by the patient/ family members as noted.   - Additionally my office staff/ schedulers were to discuss with the patient that there may be a monetary charge related to this service, depending on their medical insurance.  My understanding is that patient understood and consented to proceed.     _________________________________________________________________________________   History of Present Illness:  I, Peggye Fothergill, am serving as Neurosurgeon for Allen Castillo.  - Acute URI Symptoms Patient reports that his eyes are red, started itching and burning.  Notes "my nose is stuffed up and running, watery eyes."  He's also sneezing.  Says "it kind of takes the 'get up and go' out of you, it really drags you down."  He had similar symptoms last year at this time.  For relief, he has tried Zyrtec daily, Flonase, Nasacort.  "Whatever's out there, I've tried."    He has been taking Zyrtec, but notes "it wasn't doing anything."  Denies fever, chills.  Denies one-sided face pain.  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  He sometimes goes outside with the dog and goes outside to do yard work, but state "it's not an every day thing."    No flowsheet data found.  Depression screen [facility] 2/9 10/04/2019 03/22/2019 02/22/2019 10/27/2018  Decreased Interest 0 0 3 0  Down, Depressed, Hopeless 0 0 0 0  PHQ - 2 Score  0 0 3 0  Altered sleeping 1 0 1 0  Tired, decreased energy 0 0 0 0  Change in appetite 0 0 0 0  Feeling bad or failure about yourself  0 0 0 0  Trouble concentrating 0 0 0 0  Moving slowly or fidgety/restless 0 0 0 0  Suicidal thoughts 0 0 0 0  PHQ-9 Score 1 0 4 0  Difficult doing work/chores Not difficult at all - Not difficult at all -      Impression and Recommendations:     1. Environmental and seasonal allergies   2. Allergic conjunctivitis and rhinitis, bilateral   3. Acute sinusitis, recurrence not specified, unspecified location      Of note, this is my first time meeting patient.  Patient is new to me and was previously being cared for at our office by an NP, who no longer works at [facility].   Will be seen by Mayer Masker, PA-C in future.   - Discussed prevention of allergy symptoms and acute allergy flare with patient today.  Education provided and all questions answered.  - At this time of year, when patient goes outside for a prolonged period of time, advised him to come inside and wash his body and face to reduce exposure to outdoor allergens.  - To clear allergens from his nasal passages, advised the patient to begin using AYR or Neilmed sinus rinses BID followed by nasal spray such as Flonase BID (  one spray to each nostril).   - Flonase provided today.  See med list.  - Advised that the patient may also incorporate an OTC daily antihistamine PRN.  - Discussed option of prescription antihistamine Xyzal.  Xyzal provided today.  See med list.  - In addition, discussed beginning Singulair for preventative maintenance today. - Singulair provided today.  See med list.  - Discussed that these preventative measures should be performed daily leading up to and during allergy season.  - For relief of eye itching, Pataday once-daily prescription provided.  See med list.  - For relief of acute flare, discussed prudent use of prednisone with patient  today.  Patient understands risks and benefits of prednisone use and denies complications such as diabetes. - Steroid 10 mg, five days, prescribed today.  See med list.  - If fever develops of 100.5 or more, patient begins experiencing chills, or develops one-sided face pain, patient knows to call in for additional attention.    - As part of my medical decision making, I reviewed the following data within the electronic MEDICAL RECORD NUMBER History obtained from pt /family, CMA notes reviewed and incorporated if applicable, Labs reviewed, Radiograph/ tests reviewed if applicable and OV notes from prior OV's with me, as well as other specialists she/he has seen since seeing me last, were all reviewed and used in my medical decision making process today.    - Additionally, when appropriate, discussion had with patient regarding our treatment plan, and their biases/concerns about that plan were used in my medical decision making today.    - The patient agreed with the plan and demonstrated an understanding of the instructions.   No barriers to understanding were identified.     - The patient was advised to call back or seek an in-person evaluation if the symptoms worsen or if the condition fails to improve as anticipated.   No follow-ups on file.    No orders of the defined types were placed in this encounter.   Meds ordered this encounter  Medications  . Olopatadine HCl (PATADAY) 0.2 % SOLN    Sig: 1 drop each eye daily for itch/ watery eyes due to allergies    Dispense:  10 mL    Refill:  0  . levocetirizine (XYZAL) 5 MG tablet    Sig: Take 1 tablet (5 mg total) by mouth every evening.    Dispense:  90 tablet    Refill:  0  . fluticasone (FLONASE) 50 MCG/ACT nasal spray    Sig: 1 spray each nostril following sinus rinses twice daily    Dispense:  16 g    Refill:  2  . montelukast (SINGULAIR) 10 MG tablet    Sig: Take 1 tablet (10 mg total) by mouth at bedtime.    Dispense:  90 tablet     Refill:  0  . predniSONE (DELTASONE) 10 MG tablet    Sig: Take 1 tablet (10 mg total) by mouth daily with breakfast for 5 days.    Dispense:  5 tablet    Refill:  0    Medications Discontinued During This Encounter  Medication Reason  . methylPREDNISolone (MEDROL DOSEPAK) 4 MG TBPK tablet Error       Time spent on visit including pre-visit chart review and post-visit care was 7 minutes, 30 seconds.    Note:  This note was prepared with assistance of Dragon voice recognition software. Occasional wrong-word or sound-a-like substitutions may have occurred due to the inherent  limitations of voice recognition software.  The [place] was signed into law in 2016 which includes the topic of electronic health records.  This provides immediate access to information in MyChart.  This includes consultation notes, operative notes, office notes, lab results and pathology reports.  If you have any questions about what you read please let us know at your next visit or call us at the office.  We are right here with you.  This document serves as a record of services personally performed by Mellody Dance, DO. It was created on her behalf by Toni Amend, a trained medical scribe. The creation of this record is based on the scribe's personal observations and the provider's statements to them.    The above documentation from Toni Amend, medical scribe, has been reviewed by Marjory Sneddon, D.O. __________________________________________________________________________________     Patient Care Team    Relationship Specialty Notifications Start End  Esaw Grandchild, NP PCP - General Family Medicine  10/25/18      -Vitals obtained; medications/ allergies reconciled;  personal medical, social, Sx etc.histories were updated by CMA, reviewed by me and are reflected in chart   Patient Active Problem List   Diagnosis Date Noted  . Plantar fasciitis 05/15/2019  .  Elevated BP without diagnosis of hypertension 03/22/2019  . Elevated LDL cholesterol level 02/22/2019  . Rosacea 10/27/2018  . Glaucoma 10/27/2018  . Healthcare maintenance 10/27/2018     Current Meds  Medication Sig  . atorvastatin (LIPITOR) 10 MG tablet Take 1 tablet (10 mg total) by mouth daily.  Marland Kitchen latanoprost (XALATAN) 0.005 % ophthalmic solution Place 1 drop into both eyes daily.  . Omega-3 Fatty Acids (FISH OIL BURP-LESS PO) Take 1 capsule by mouth daily.  . saw palmetto 500 MG capsule Take 500 mg by mouth daily.  . timolol (TIMOPTIC) 0.5 % ophthalmic solution   . tretinoin (RETIN-A) 0.025 % cream Apply 1 application topically at bedtime.  . triamcinolone cream (KENALOG) 0.1 % SMARTSIG:1 Application Topical 2-3 Times Daily     Allergies:  No Known Allergies   ROS:  See above HPI for pertinent positives and negatives   Objective:   Height 5\' 11"  (1.803 m), weight 185 lb (83.9 kg).  (if some vitals are omitted, this means that patient was UNABLE to obtain them even though they were asked to get them prior to OV today.  They were asked to call us at their earliest convenience with these once obtained. ) General: A & O * 3; sounds in no acute distress; in usual state of health.  Skin: Pt confirms warm and dry extremities and pink fingertips HEENT: Pt confirms lips non-cyanotic Chest: Patient confirms normal chest excursion and movement Respiratory: speaking in full sentences, no conversational dyspnea; patient confirms no use of accessory muscles Psych: insight appears good, mood- appears full

## 2020-01-24 ENCOUNTER — Ambulatory Visit: Payer: 59 | Admitting: Physician Assistant

## 2020-01-24 ENCOUNTER — Other Ambulatory Visit: Payer: Self-pay

## 2020-01-24 ENCOUNTER — Encounter: Payer: Self-pay | Admitting: Physician Assistant

## 2020-01-24 VITALS — BP 128/75 | HR 65 | Temp 97.6°F | Ht 71.0 in | Wt 193.1 lb

## 2020-01-24 DIAGNOSIS — M549 Dorsalgia, unspecified: Secondary | ICD-10-CM | POA: Diagnosis not present

## 2020-01-24 DIAGNOSIS — M62838 Other muscle spasm: Secondary | ICD-10-CM

## 2020-01-24 DIAGNOSIS — Z9889 Other specified postprocedural states: Secondary | ICD-10-CM | POA: Diagnosis not present

## 2020-01-24 DIAGNOSIS — G8929 Other chronic pain: Secondary | ICD-10-CM | POA: Diagnosis not present

## 2020-01-24 MED ORDER — BACLOFEN 10 MG PO TABS: 10.0000 mg | ORAL_TABLET | Freq: Two times a day (BID) | ORAL | 0 refills | Status: DC | PRN

## 2020-01-24 NOTE — Progress Notes (Signed)
Acute Office Visit  Subjective:    Patient ID: Allen Castillo, male    DOB: [birth year], 59 y.o.   MRN: [id number]  Chief Complaint  Patient presents with  . Back Pain    HPI Patient is in today for chronic back pain.  Reports the pain moves around, sometimes has pain on his upper back and other times it is his low back.  Reports pain feels like a tightening sensation and rates it a 9 out of 10.  Has tried meloxicam with minimal relief.  He has been using icy hot pads which do provide some relief.  Denies numbness or tingling down his arms.  He does report a soreness sensation on his left thigh and then has foot numbness.  Denies fever, chills, unintentional weight loss or bladder/bowel dysfunction. Reports history of spine surgery of C6-C7 fusion back in 1999.  Past Medical History:  Diagnosis Date  . Glaucoma     Past Surgical History:  Procedure Laterality Date  . SPINE SURGERY      Family History  Problem Relation Age of Onset  . Healthy Mother   . Healthy Father   . Healthy Brother     Social History   Socioeconomic History  . Marital status: Married    Spouse name: Not on file  . Number of children: Not on file  . Years of education: Not on file  . Highest education level: Not on file  Occupational History  . Not on file  Tobacco Use  . Smoking status: Never Smoker  . Smokeless tobacco: Never Used  Vaping Use  . Vaping Use: Never used  Substance and Sexual Activity  . Alcohol use: Never  . Drug use: Never  . Sexual activity: Yes  Other Topics Concern  . Not on file  Social History Narrative  . Not on file   Social Determinants of Health   Financial Resource Strain:   . Difficulty of Paying Living Expenses:   Food Insecurity:   . Worried About Programme researcher, broadcasting/film/video in the Last Year:   . Barista in the Last Year:   Transportation Needs:   . Freight forwarder (Medical):   Marland Kitchen Lack of Transportation (Non-Medical):   Physical Activity:    . Days of Exercise per Week:   . Minutes of Exercise per Session:   Stress:   . Feeling of Stress :   Social Connections:   . Frequency of Communication with Friends and Family:   . Frequency of Social Gatherings with Friends and Family:   . Attends Religious Services:   . Active Member of Clubs or Organizations:   . Attends Banker Meetings:   Marland Kitchen Marital Status:   Intimate Partner Violence:   . Fear of Current or Ex-Partner:   . Emotionally Abused:   Marland Kitchen Physically Abused:   . Sexually Abused:     Outpatient Medications Prior to Visit  Medication Sig Dispense Refill  . atorvastatin (LIPITOR) 10 MG tablet Take 1 tablet (10 mg total) by mouth daily. 90 tablet 0  . fluticasone (FLONASE) 50 MCG/ACT nasal spray 1 spray each nostril following sinus rinses twice daily 16 g 2  . latanoprost (XALATAN) 0.005 % ophthalmic solution Place 1 drop into both eyes daily.    Marland Kitchen levocetirizine (XYZAL) 5 MG tablet Take 1 tablet (5 mg total) by mouth every evening. 90 tablet 0  . metroNIDAZOLE (METROGEL) 0.75 % gel Apply 1 application topically 2 (two)  times daily. 45 g 2  . montelukast (SINGULAIR) 10 MG tablet Take 1 tablet (10 mg total) by mouth at bedtime. 90 tablet 0  . Olopatadine HCl (PATADAY) 0.2 % SOLN 1 drop each eye daily for itch/ watery eyes due to allergies 10 mL 0  . Omega-3 Fatty Acids (FISH OIL BURP-LESS PO) Take 1 capsule by mouth daily.    . saw palmetto 500 MG capsule Take 500 mg by mouth daily.    . timolol (TIMOPTIC) 0.5 % ophthalmic solution     . tretinoin (RETIN-A) 0.025 % cream Apply 1 application topically at bedtime.    . triamcinolone cream (KENALOG) 0.1 % SMARTSIG:1 Application Topical 2-3 Times Daily     No facility-administered medications prior to visit.    No Known Allergies  Review of Systems Review of Systems:  A fourteen system review of systems was performed and found to be positive as per HPI.   Objective:    Physical Exam General: Well  nourished, in no apparent distress. Eyes: PERRLA, EOMs, conjunctiva clr Resp: Respiratory effort- normal, ECTA B/L w/o W/R/R  Cardio: RRR w/o MRGs. Abdomen: no gross distention. Lymphatics:  less 2 sec cap RF M-sk: Good ROM, 5/5 strength, normal gait, TTP of upper back/shoulders, muscle tension present Skin: Warm, dry  Neuro: Alert, Oriented Psych: Normal affect, Insight and Judgment appropriate.     BP 128/75   Pulse 65   Temp 97.6 F (36.4 C) (Oral)   Ht 5\' 11"  (1.803 m)   Wt 193 lb 1.6 oz (87.6 kg)   SpO2 98%   BMI 26.93 kg/m  Wt Readings from Last 3 Encounters:  01/24/20 193 lb 1.6 oz (87.6 kg)  10/04/19 185 lb (83.9 kg)  03/22/19 193 lb (87.5 kg)    Health Maintenance Due  Topic Date Due  . Hepatitis C Screening  Never done  . HIV Screening  Never done  . TETANUS/TDAP  Never done  . INFLUENZA VACCINE  01/22/2020    There are no preventive care reminders to display for this patient.   Lab Results  Component Value Date   TSH 0.910 02/14/2019   Lab Results  Component Value Date   WBC 5.8 02/14/2019   HGB 14.4 02/14/2019   HCT 41.6 02/14/2019   MCV 86 02/14/2019   PLT 226 02/14/2019   Lab Results  Component Value Date   NA 140 02/14/2019   K 4.7 02/14/2019   CO2 24 02/14/2019   GLUCOSE 103 (H) 02/14/2019   BUN 14 02/14/2019   CREATININE 0.97 02/14/2019   BILITOT 0.5 08/15/2019   ALKPHOS 77 08/15/2019   AST 23 08/15/2019   ALT 26 08/15/2019   PROT 6.7 08/15/2019   ALBUMIN 4.0 08/15/2019   CALCIUM 9.4 02/14/2019   Lab Results  Component Value Date   CHOL 153 08/15/2019   Lab Results  Component Value Date   HDL 38 (L) 08/15/2019   Lab Results  Component Value Date   LDLCALC 84 08/15/2019   Lab Results  Component Value Date   TRIG 179 (H) 08/15/2019   Lab Results  Component Value Date   CHOLHDL 4.0 08/15/2019   Lab Results  Component Value Date   HGBA1C 5.2 02/14/2019       Assessment & Plan:   Problem List Items Addressed  This Visit    None    Visit Diagnoses    Chronic back pain, unspecified back location, unspecified back pain laterality    -  Primary  Relevant Medications   baclofen (LIORESAL) 10 MG tablet   Other Relevant Orders   DG Lumbar Spine Complete   Muscle spasticity       Relevant Medications   baclofen (LIORESAL) 10 MG tablet     Chronic back pain, muscle spasticity: -Placed orders for thoracic and lumbar X-rays for further evaluation. -Pt denies red flag signs and symptoms are suggestive of muscle spasms and muscle tension is noted during exam, so will start muscle relaxer. -Continue to apply icy hot as needed. -If symptoms fail to improve or worsen suggest referral to specialist. Pt verbalized understanding and agreeable.    Meds ordered this encounter  Medications  . baclofen (LIORESAL) 10 MG tablet    Sig: Take 1 tablet (10 mg total) by mouth 2 (two) times daily as needed for muscle spasms.    Dispense:  60 each    Refill:  0    Order Specific Question:   Supervising Provider    Answer:   Nani Gasser D [[id number]]     Mayer Masker, PA-C

## 2020-01-24 NOTE — Patient Instructions (Addendum)
[facility]: [address] [place], [place]  Chronic Back Pain When back pain lasts longer than 3 months, it is called chronic back pain.The cause of your back pain may not be known. Some common causes include:  Wear and tear (degenerative disease) of the bones, ligaments, or disks in your back.  Inflammation and stiffness in your back (arthritis). People who have chronic back pain often go through certain periods in which the pain is more intense (flare-ups). Many people can learn to manage the pain with home care. Follow these instructions at home: Pay attention to any changes in your symptoms. Take these actions to help with your pain: Activity   Avoid bending and other activities that make the problem worse.  Maintain a proper position when standing or sitting: ? When standing, keep your upper back and neck straight, with your shoulders pulled back. Avoid slouching. ? When sitting, keep your back straight and relax your shoulders. Do not round your shoulders or pull them backward.  Do not sit or stand in one place for long periods of time.  Take brief periods of rest throughout the day. This will reduce your pain. Resting in a lying or standing position is usually better than sitting to rest.  When you are resting for longer periods, mix in some mild activity or stretching between periods of rest. This will help to prevent stiffness and pain.  Get regular exercise. Ask your health care provider what activities are safe for you.  Do not lift anything that is heavier than 10 lb (4.5 kg). Always use proper lifting technique, which includes: ? Bending your knees. ? Keeping the load close to your body. ? Avoiding twisting.  Sleep on a firm mattress in a comfortable position. Try lying on your side with your knees slightly bent. If you lie on your back, put a pillow under your knees. Managing pain  If directed, apply ice to the painful area. Your health care provider  may recommend applying ice during the first 24-48 hours after a flare-up begins. ? Put ice in a plastic bag. ? Place a towel between your skin and the bag. ? Leave the ice on for 20 minutes, 2-3 times per day.  If directed, apply heat to the affected area as often as told by your health care provider. Use the heat source that your health care provider recommends, such as a moist heat pack or a heating pad. ? Place a towel between your skin and the heat source. ? Leave the heat on for 20-30 minutes. ? Remove the heat if your skin turns bright red. This is especially important if you are unable to feel pain, heat, or cold. You may have a greater risk of getting burned.  Try soaking in a warm tub.  Take over-the-counter and prescription medicines only as told by your health care provider.  Keep all follow-up visits as told by your health care provider. This is important. Contact a health care provider if:  You have pain that is not relieved with rest or medicine. Get help right away if:  You have weakness or numbness in one or both of your legs or feet.  You have trouble controlling your bladder or your bowels.  You have nausea or vomiting.  You have pain in your abdomen.  You have shortness of breath or you faint. This information is not intended to replace advice given to you by your health care provider. Make sure you discuss any questions you  have with your health care provider. Document Revised: 09/30/2018 Document Reviewed: 12/17/2016 Elsevier Patient Education  2020 Elsevier Inc.      Spasticity Spasticity is a condition in which your muscles contract suddenly and unpredictably (spasm). Spasticity usually affects your arms, legs, or back. It can also affect the way you walk. Spasticity can range from mild muscle stiffness and tightness to severe, uncontrollable muscle spasms. Severe spasticity can be painful and can freeze your muscles in an uncomfortable position. Follow  these instructions at home: Managing muscle stiffness and spasms      Wear a brace as told by your health care provider to prevent muscle contractions.  Have the affected muscles massaged.  If directed, apply heat to the affected muscle area. Use the heat source that your health care provider recommends, such as a moist heat pack or heating pad. ? Place a towel between your skin and the heat source. ? Leave the heat on for 20-30 minutes. ? Remove the head if your skin turns bright red. This is especially important if you are unable to feel pain, heat, or cold. You may have a greater risk of getting burned.  If directed, apply ice to the affected muscle area: ? Put ice in a plastic bag. ? Place a towel between your skin and the bag or between your brace and the bag. ? Leave the ice on for 20 minutes, 2?3 times a day. Activity  Stay active as directed by your health care provider. Find a safe exercise program that fits your needs and ability.  Maintain good posture when walking and sitting.  Work with a physical therapist to learn exercises that will stretch and strengthen your muscles.  Do stretching and range of motion exercises at home as told by a physical therapist.  Work with an occupational therapist. This type of health care provider can help you function better at home and at work.  If you have severe spasticity, use mobility aids, such as a walker or cane, as told by your health care provider. General instructions  Watch your condition for any changes.  Wear loose, comfortable clothing that does not restrict your movement.  Wear closed-toe shoes that fit well and support your feet. Wear shoes that have rubber soles or low heels.  Keep all follow-up visits as told by your health care provider. This is important.  Take over-the-counter and prescription medicine only as told by your health care provider. Contact a health care provider if you:  Have worsening muscle  spasms.  Develop other symptoms along with spasticity.  Have a fever or chills.  Experience a burning feeling when you pass urine.  Become constipated.  Need more support at home. Get help right away if you:  Have trouble breathing.  Have a muscle spasm that freezes you into a painful position.  Cannot walk.  Cannot care for yourself at home.  Have trouble passing urine or have urinary incontinence. Summary  Spasticity is a condition in which your muscles contract suddenly and unpredictably (spasm). Spasticity usually affects your arms, legs, or back.  Spasticity can range from mild muscle stiffness and tightness to severe, uncontrollable muscle spasms.  Do stretching and range of motion exercises at home as told by a physical therapist.  Take over-the-counter and prescription medicine only as told by your health care provider. This information is not intended to replace advice given to you by your health care provider. Make sure you discuss any questions you have with your health  care provider. Document Revised: 07/07/2017 Document Reviewed: 07/07/2017 Elsevier Patient Education  2020 [employer].

## 2020-01-30 ENCOUNTER — Other Ambulatory Visit: Payer: Self-pay | Admitting: Physician Assistant

## 2020-01-30 ENCOUNTER — Ambulatory Visit
Admission: RE | Admit: 2020-01-30 | Discharge: 2020-01-30 | Disposition: A | Discharge status: Home / Self Care | Payer: 59 | Source: Ambulatory Visit | Attending: Physician Assistant | Admitting: Physician Assistant

## 2020-01-30 DIAGNOSIS — Z9889 Other specified postprocedural states: Secondary | ICD-10-CM

## 2020-01-30 DIAGNOSIS — M549 Dorsalgia, unspecified: Secondary | ICD-10-CM

## 2020-01-30 DIAGNOSIS — G8929 Other chronic pain: Secondary | ICD-10-CM

## 2020-02-14 ENCOUNTER — Other Ambulatory Visit: Payer: Self-pay | Admitting: Physician Assistant

## 2020-02-14 DIAGNOSIS — E7800 Pure hypercholesterolemia, unspecified: Secondary | ICD-10-CM

## 2020-02-14 DIAGNOSIS — R030 Elevated blood-pressure reading, without diagnosis of hypertension: Secondary | ICD-10-CM

## 2020-02-14 DIAGNOSIS — Z0000 Encounter for general adult medical examination without abnormal findings: Secondary | ICD-10-CM

## 2020-02-17 ENCOUNTER — Other Ambulatory Visit: Payer: Self-pay

## 2020-02-17 ENCOUNTER — Other Ambulatory Visit: Payer: 59

## 2020-02-17 DIAGNOSIS — Z0000 Encounter for general adult medical examination without abnormal findings: Secondary | ICD-10-CM

## 2020-02-17 DIAGNOSIS — R030 Elevated blood-pressure reading, without diagnosis of hypertension: Secondary | ICD-10-CM

## 2020-02-17 DIAGNOSIS — E7800 Pure hypercholesterolemia, unspecified: Secondary | ICD-10-CM

## 2020-02-20 LAB — COMPREHENSIVE METABOLIC PANEL
ALT: 23 IU/L (ref 0–44)
AST: 28 IU/L (ref 0–40)
Albumin/Globulin Ratio: 1.4 (ref 1.2–2.2)
Albumin: 3.9 g/dL (ref 3.8–4.9)
Alkaline Phosphatase: 71 IU/L (ref 48–121)
BUN/Creatinine Ratio: 12 (ref 10–24)
BUN: 11 mg/dL (ref 8–27)
Bilirubin Total: 0.5 mg/dL (ref 0.0–1.2)
CO2: 24 mmol/L (ref 20–29)
Calcium: 9 mg/dL (ref 8.6–10.2)
Chloride: 106 mmol/L (ref 96–106)
Creatinine, Ser: 0.89 mg/dL (ref 0.76–1.27)
GFR calc Af Amer: 107 mL/min/{1.73_m2} (ref >59)
GFR calc non Af Amer: 93 mL/min/{1.73_m2} (ref >59)
Globulin, Total: 2.7 g/dL (ref 1.5–4.5)
Glucose: 99 mg/dL (ref 65–99)
Potassium: 4.6 mmol/L (ref 3.5–5.2)
Sodium: 141 mmol/L (ref 134–144)
Total Protein: 6.6 g/dL (ref 6.0–8.5)

## 2020-02-20 LAB — LIPID PANEL
Chol/HDL Ratio: 5 ratio (ref 0.0–5.0)
Cholesterol, Total: 212 mg/dL — ABNORMAL HIGH (ref 100–199)
HDL: 42 mg/dL (ref >39)
LDL Chol Calc (NIH): 143 mg/dL — ABNORMAL HIGH (ref 0–99)
Triglycerides: 152 mg/dL — ABNORMAL HIGH (ref 0–149)
VLDL Cholesterol Cal: 27 mg/dL (ref 5–40)

## 2020-02-20 LAB — CBC
Hematocrit: 41 % (ref 37.5–51.0)
Hemoglobin: 14 g/dL (ref 13.0–17.7)
MCH: 29.9 pg (ref 26.6–33.0)
MCHC: 34.1 g/dL (ref 31.5–35.7)
MCV: 88 fL (ref 79–97)
Platelets: 194 10*3/uL (ref 150–450)
RBC: 4.68 x10E6/uL (ref 4.14–5.80)
RDW: 12.5 % (ref 11.6–15.4)
WBC: 5.1 10*3/uL (ref 3.4–10.8)

## 2020-02-20 LAB — TSH: TSH: 1.31 u[IU]/mL (ref 0.450–4.500)

## 2020-02-20 LAB — HEMOGLOBIN A1C
Est. average glucose Bld gHb Est-mCnc: 105 mg/dL
Hgb A1c MFr Bld: 5.3 % (ref 4.8–5.6)

## 2020-02-20 LAB — SARS-COV-2 ANTIBODY, IGM: SARS-CoV-2 Antibody, IgM: NEGATIVE

## 2020-02-23 ENCOUNTER — Encounter: Payer: 59 | Admitting: Physician Assistant

## 2020-03-22 ENCOUNTER — Encounter: Payer: Self-pay | Admitting: Physician Assistant

## 2020-03-22 ENCOUNTER — Other Ambulatory Visit: Payer: Self-pay

## 2020-03-22 ENCOUNTER — Ambulatory Visit (INDEPENDENT_AMBULATORY_CARE_PROVIDER_SITE_OTHER): Payer: 59 | Admitting: Physician Assistant

## 2020-03-22 VITALS — BP 133/82 | HR 65 | Ht 71.0 in | Wt 189.7 lb

## 2020-03-22 DIAGNOSIS — Z0000 Encounter for general adult medical examination without abnormal findings: Secondary | ICD-10-CM | POA: Diagnosis not present

## 2020-03-22 DIAGNOSIS — J3089 Other allergic rhinitis: Secondary | ICD-10-CM | POA: Diagnosis not present

## 2020-03-22 DIAGNOSIS — E7800 Pure hypercholesterolemia, unspecified: Secondary | ICD-10-CM | POA: Diagnosis not present

## 2020-03-22 NOTE — Patient Instructions (Signed)
Preventive Care 41-61 Years Old, Male Preventive care refers to lifestyle choices and visits with your health care provider that can promote health and wellness. This includes:  A yearly physical exam. This is also called an annual well check.  Regular dental and eye exams.  Immunizations.  Screening for certain conditions.  Healthy lifestyle choices, such as eating a healthy diet, getting regular exercise, not using drugs or products that contain nicotine and tobacco, and limiting alcohol use. What can I expect for my preventive care visit? Physical exam Your health care provider will check:  Height and weight. These may be used to calculate body mass index (BMI), which is a measurement that tells if you are at a healthy weight.  Heart rate and blood pressure.  Your skin for abnormal spots. Counseling Your health care provider may ask you questions about:  Alcohol, tobacco, and drug use.  Emotional well-being.  Home and relationship well-being.  Sexual activity.  Eating habits.  Work and work Statistician. What immunizations do I need?  Influenza (flu) vaccine  This is recommended every year. Tetanus, diphtheria, and pertussis (Tdap) vaccine  You may need a Td booster every 10 years. Varicella (chickenpox) vaccine  You may need this vaccine if you have not already been vaccinated. Zoster (shingles) vaccine  You may need this after age 64. Measles, mumps, and rubella (MMR) vaccine  You may need at least one dose of MMR if you were born in 1957 or later. You may also need a second dose. Pneumococcal conjugate (PCV13) vaccine  You may need this if you have certain conditions and were not previously vaccinated. Pneumococcal polysaccharide (PPSV23) vaccine  You may need one or two doses if you smoke cigarettes or if you have certain conditions. Meningococcal conjugate (MenACWY) vaccine  You may need this if you have certain conditions. Hepatitis A  vaccine  You may need this if you have certain conditions or if you travel or work in places where you may be exposed to hepatitis A. Hepatitis B vaccine  You may need this if you have certain conditions or if you travel or work in places where you may be exposed to hepatitis B. Haemophilus influenzae type b (Hib) vaccine  You may need this if you have certain risk factors. Human papillomavirus (HPV) vaccine  If recommended by your health care provider, you may need three doses over 6 months. You may receive vaccines as individual doses or as more than one vaccine together in one shot (combination vaccines). Talk with your health care provider about the risks and benefits of combination vaccines. What tests do I need? Blood tests  Lipid and cholesterol levels. These may be checked every 5 years, or more frequently if you are over 60 years old.  Hepatitis C test.  Hepatitis B test. Screening  Lung cancer screening. You may have this screening every year starting at age 43 if you have a 30-pack-year history of smoking and currently smoke or have quit within the past 15 years.  Prostate cancer screening. Recommendations will vary depending on your family history and other risks.  Colorectal cancer screening. All adults should have this screening starting at age 72 and continuing until age 2. Your health care provider may recommend screening at age 14 if you are at increased risk. You will have tests every 1-10 years, depending on your results and the type of screening test.  Diabetes screening. This is done by checking your blood sugar (glucose) after you have not eaten  for a while (fasting). You may have this done every 1-3 years.  Sexually transmitted disease (STD) testing. Follow these instructions at home: Eating and drinking  Eat a diet that includes fresh fruits and vegetables, whole grains, lean protein, and low-fat dairy products.  Take vitamin and mineral supplements as  recommended by your health care provider.  Do not drink alcohol if your health care provider tells you not to drink.  If you drink alcohol: ? Limit how much you have to 0-2 drinks a day. ? Be aware of how much alcohol is in your drink. In the [place] one drink equals one 12 oz bottle of beer (355 mL), one 5 oz glass of wine (148 mL), or one 1 oz glass of hard liquor (44 mL). Lifestyle  Take daily care of your teeth and gums.  Stay active. Exercise for at least 30 minutes on 5 or more days each week.  Do not use any products that contain nicotine or tobacco, such as cigarettes, e-cigarettes, and chewing tobacco. If you need help quitting, ask your health care provider.  If you are sexually active, practice safe sex. Use a condom or other form of protection to prevent STIs (sexually transmitted infections).  Talk with your health care provider about taking a low-dose aspirin every day starting at age 50. What's next?  Go to your health care provider once a year for a well check visit.  Ask your health care provider how often you should have your eyes and teeth checked.  Stay up to date on all vaccines. This information is not intended to replace advice given to you by your health care provider. Make sure you discuss any questions you have with your health care provider. Document Revised: 06/03/2018 Document Reviewed: 06/03/2018 Elsevier Patient Education  2020 Elsevier Inc.  

## 2020-03-22 NOTE — Progress Notes (Signed)
Male physical   Impression and Recommendations:    1. Healthcare maintenance   2. Elevated LDL cholesterol level   3. Environmental and seasonal allergies      1) Anticipatory Guidance: Discussed skin CA prevention and sunscreen when outside along with skin surveillance; eating a balanced and modest diet; physical activity at least 25 minutes per day or minimum of 150 min/ week moderate to intense activity.  2) Immunizations / Screenings / Labs:   All immunizations are up-to-date per recommendations or will be updated today if pt allows.    - Patient understands with dental and vision screens they will schedule independently.  - Obtained CBC, CMP, HgA1c, Lipid panel, TSH and vit D when fasting. Most labs essentially wnl or stable from prior. Lipid panel- total cholesterol, triglycerides, and LDL elevated. - UTD on colonoscopy. - Pt declined Tdap, Shingrix, Influenza vaccines, Hep C and HIV screenings  3) Weight:  BMI meaning discussed with patient.  Discussed goal to improve diet habits to improve overall feelings of well being and objective health data. Improve nutrient density of diet through increasing intake of fruits and vegetables and decreasing saturated fats, white flour products and refined sugars.   4) Healthcare Maintenance: -Continue current medication regimen. -Recommend to monitor symptoms of bruising. CBC wnl -Follow a heart healthy diet and reduce saturated and trans fats. -Continue to stay active. -Stay well hydrated.  -Follow up in 1 year for CPE and FBW or sooner if needed; repeat lipid panel in 6 months (lab visit).  No orders of the defined types were placed in this encounter.   No orders of the defined types were placed in this encounter.    Return in about 1 year (around 03/22/2021) for CPE and FBW; lab visit in 6 months for repeat lipid panel.     Gross side effects, risk and benefits, and alternatives of medications discussed with patient.   Patient is aware that all medications have potential side effects and we are unable to predict every side effect or drug-drug interaction that may occur.  Expresses verbal understanding and consents to current therapy plan and treatment regimen.  Please see AVS handed out to patient at the end of our visit for further patient instructions/ counseling done pertaining to today's office visit.       Subjective:        CC: CPE   HPI: DOMINIQ FONTAINE is a 59 y.o. male who presents to [facility] today for a yearly health maintenance exam.     Health Maintenance Summary  - Reviewed and updated, unless pt declines services.  Last Cologuard or Colonoscopy:   11/02/17- repeat in 10 yrs Family history of Colon CA:  N  Tobacco History Reviewed: Y, never smoker Alcohol / drug use:    No concerns, no excessive use / no use Dental Home: New to area, has not established with dentist yet Eye exams: Y Dermatology home: Y  Male history: STD concerns:  none Additional penile/ urinary concerns: none   Additional concerns beyond Health Maintenance issues:   Easy bruising, not on blood thinner     There is no immunization history on file for this patient.   Health Maintenance  Topic Date Due  . Hepatitis C Screening  Never done  . HIV Screening  Never done  . TETANUS/TDAP  Never done  . INFLUENZA VACCINE  Never done  . COLONOSCOPY  11/21/2027  Wt Readings from Last 3 Encounters:  03/22/20 189 lb 11.2 oz (86 kg)  01/24/20 193 lb 1.6 oz (87.6 kg)  10/04/19 185 lb (83.9 kg)   BP Readings from Last 3 Encounters:  03/22/20 133/82  01/24/20 128/75  03/22/19 118/71   Pulse Readings from Last 3 Encounters:  03/22/20 65  01/24/20 65  03/22/19 66    Patient Active Problem List   Diagnosis Date Noted  . Plantar fasciitis 05/15/2019  . Elevated BP without diagnosis of hypertension 03/22/2019  . Elevated LDL cholesterol level 02/22/2019  .  Rosacea 10/27/2018  . Glaucoma 10/27/2018  . Healthcare maintenance 10/27/2018    Past Medical History:  Diagnosis Date  . Glaucoma     Past Surgical History:  Procedure Laterality Date  . SPINE SURGERY      Family History  Problem Relation Age of Onset  . Healthy Mother   . Healthy Father   . Healthy Brother     Social History   Substance and Sexual Activity  Drug Use Never  ,  Social History   Substance and Sexual Activity  Alcohol Use Never  ,  Social History   Tobacco Use  Smoking Status Never Smoker  Smokeless Tobacco Never Used  ,  Social History   Substance and Sexual Activity  Sexual Activity Yes    Patient's Medications  New Prescriptions   No medications on file  Previous Medications   BACLOFEN (LIORESAL) 10 MG TABLET    Take 1 tablet (10 mg total) by mouth 2 (two) times daily as needed for muscle spasms.   FLUTICASONE (FLONASE) 50 MCG/ACT NASAL SPRAY    1 spray each nostril following sinus rinses twice daily   LATANOPROST (XALATAN) 0.005 % OPHTHALMIC SOLUTION    Place 1 drop into both eyes daily.   LEVOCETIRIZINE (XYZAL) 5 MG TABLET    Take 1 tablet (5 mg total) by mouth every evening.   METRONIDAZOLE (METROGEL) 0.75 % GEL    Apply 1 application topically 2 (two) times daily.   MONTELUKAST (SINGULAIR) 10 MG TABLET    Take 1 tablet (10 mg total) by mouth at bedtime.   OLOPATADINE HCL (PATADAY) 0.2 % SOLN    1 drop each eye daily for itch/ watery eyes due to allergies   OMEGA-3 FATTY ACIDS (FISH OIL BURP-LESS PO)    Take 1 capsule by mouth daily.   SAW PALMETTO 500 MG CAPSULE    Take 500 mg by mouth daily.   TIMOLOL (TIMOPTIC) 0.5 % OPHTHALMIC SOLUTION       TRETINOIN (RETIN-A) 0.025 % CREAM    Apply 1 application topically at bedtime.   TRIAMCINOLONE CREAM (KENALOG) 0.1 %    SMARTSIG:1 Application Topical 2-3 Times Daily  Modified Medications   No medications on file  Discontinued Medications   ATORVASTATIN (LIPITOR) 10 MG TABLET    Take 1  tablet (10 mg total) by mouth daily.    Patient has no known allergies.  Review of Systems: General:   Denies fever, chills, unexplained weight loss.  Optho/Auditory:   Denies visual changes, blurred vision/LOV Respiratory:   Denies SOB, DOE more than baseline levels.   Cardiovascular:   Denies chest pain, palpitations, new onset peripheral edema  Gastrointestinal:   Denies nausea, vomiting, diarrhea.  Genitourinary: Denies dysuria, freq/ urgency, flank pain   Endocrine:     Denies hot or cold intolerance, polyuria, polydipsia. Musculoskeletal:   Denies joint swelling, unexplained arthralgias, gait problems.  Skin:  Denies rash, suspicious  lesions Neurological:     Denies dizziness, unexplained weakness, numbness  Psychiatric/Behavioral:   Denies mood changes, suicidal or homicidal ideations, hallucinations    Objective:     Blood pressure 133/82, pulse 65, height 5\' 11"  (1.803 m), weight 189 lb 11.2 oz (86 kg), SpO2 98 %. Body mass index is 26.46 kg/m. General Appearance:    Alert, cooperative, no distress, appears stated age  Head:    Normocephalic, without obvious abnormality, atraumatic  Eyes:    PERRL, conjunctiva/corneas clear, EOM's intact, both eyes  Ears:    Normal TM's and external ear canals, both ears  Nose:   Nares normal, mucosa normal, no drainage    or sinus tenderness  Throat:   Lips w/o lesion, mucosa moist, and tongue normal; teeth and   gums normal  Neck:   Supple, symmetrical, trachea midline, no adenopathy;    thyroid:  no enlargement/tenderness/nodules; no carotid   bruit or JVD  Back:     Symmetric, no curvature, ROM normal, no CVA tenderness  Lungs:     Clear to auscultation bilaterally, respirations unlabored, no Wh/ R/ R  Chest Wall:    No tenderness or gross deformity; normal excursion   Heart:    Regular rate and rhythm, S1 and S2 normal, no murmur  Abdomen:     Soft, non-tender, bowel sounds active all four quadrants, No G/R/R, no masses, no  organomegaly  Genitalia:    Ext genitalia: without lesion, no penile rash or discharge, no hernias appreciated   Rectal:    Normal tone, prostate WNL's and equal b/l, no tenderness; guaiac negative stool  Extremities:   Extremities normal, atraumatic, no cyanosis or gross edema  Pulses:   Normal  Skin:   Warm, dry, Skin color, texture, turgor normal, no obvious rashes or lesions  M-Sk:   Ambulates * 4 w/o difficulty, no gross deformities, tone WNL  Neurologic:   CNII-XII grossly intact, normal strength, sensation and reflexes    Throughout Psych:  No HI/SI, judgement and insight good, Euthymic mood. Full Affect.

## 2020-06-06 ENCOUNTER — Telehealth: Payer: Self-pay | Admitting: Physician Assistant

## 2020-06-06 NOTE — Telephone Encounter (Signed)
Patient has tested positive for COVID and would like advice and the antibody infusion details. [phone] Thanks

## 2020-06-07 NOTE — Telephone Encounter (Signed)
Left message for patient to call back. AS, CMA 

## 2020-06-08 ENCOUNTER — Other Ambulatory Visit: Payer: Self-pay | Admitting: Physician Assistant

## 2020-06-08 DIAGNOSIS — U071 COVID-19: Secondary | ICD-10-CM

## 2020-06-08 DIAGNOSIS — E663 Overweight: Secondary | ICD-10-CM

## 2020-06-08 DIAGNOSIS — R030 Elevated blood-pressure reading, without diagnosis of hypertension: Secondary | ICD-10-CM

## 2020-06-08 NOTE — Telephone Encounter (Signed)
Patient denies SOB, fever or difficulty breathing. Patient onset of symptoms 06/01/20 and patient tested positive for covid on 06/03/20. Patient requesting antibody infusion. I have called the infusion line and left message for them with patient information to contact to schedule. AS, CMA

## 2020-06-08 NOTE — Progress Notes (Signed)
I connected by phone with Allen Castillo on 06/08/2020 at 11:39 AM to discuss the potential use of a new treatment for mild to moderate COVID-19 viral infection in non-hospitalized patients.  This patient is a 59 y.o. male that meets the FDA criteria for Emergency Use Authorization of COVID monoclonal antibody casirivimab/imdevimab, bamlanivimab/etesevimab, or sotrovimab.  Has a (+) direct SARS-CoV-2 viral test result  Has mild or moderate COVID-19   Is NOT hospitalized due to COVID-19  Is within 10 days of symptom onset  Has at least one of the high risk factor(s) for progression to severe COVID-19 and/or hospitalization as defined in EUA.  Specific high risk criteria : BMI > 25 and Other high risk medical condition per CDC:  unvaccinated and geographic location   I have spoken and communicated the following to the patient or parent/caregiver regarding COVID monoclonal antibody treatment:  1. FDA has authorized the emergency use for the treatment of mild to moderate COVID-19 in adults and pediatric patients with positive results of direct SARS-CoV-2 viral testing who are 55 years of age and older weighing at least 40 kg, and who are at high risk for progressing to severe COVID-19 and/or hospitalization.  2. The significant known and potential risks and benefits of COVID monoclonal antibody, and the extent to which such potential risks and benefits are unknown.  3. Information on available alternative treatments and the risks and benefits of those alternatives, including clinical trials.  4. Patients treated with COVID monoclonal antibody should continue to self-isolate and use infection control measures (e.g., wear mask, isolate, social distance, avoid sharing personal items, clean and disinfect "high touch" surfaces, and frequent handwashing) according to CDC guidelines.   5. The patient or parent/caregiver has the option to accept or refuse COVID monoclonal antibody treatment.  After  reviewing this information with the patient, the patient has agreed to receive one of the available covid 19 monoclonal antibodies and will be provided an appropriate fact sheet prior to infusion.   [place], [place] 06/08/2020 11:39 AM

## 2020-06-09 ENCOUNTER — Ambulatory Visit (HOSPITAL_COMMUNITY)
Admission: RE | Admit: 2020-06-09 | Discharge: 2020-06-09 | Disposition: A | Discharge status: Home / Self Care | Payer: 59 | Source: Ambulatory Visit | Attending: Pulmonary Disease | Admitting: Pulmonary Disease

## 2020-06-09 DIAGNOSIS — U071 COVID-19: Secondary | ICD-10-CM | POA: Diagnosis not present

## 2020-06-09 MED ORDER — ALBUTEROL SULFATE HFA 108 (90 BASE) MCG/ACT IN AERS: 2.0000 | INHALATION_SPRAY | Freq: Once | RESPIRATORY_TRACT | Status: DC | PRN

## 2020-06-09 MED ORDER — SODIUM CHLORIDE 0.9 % IV SOLN: Freq: Once | INTRAVENOUS | Status: AC

## 2020-06-09 MED ORDER — SODIUM CHLORIDE 0.9 % IV SOLN: INTRAVENOUS | Status: DC | PRN

## 2020-06-09 MED ORDER — FAMOTIDINE IN NACL 20-0.9 MG/50ML-% IV SOLN: 20.0000 mg | Freq: Once | INTRAVENOUS | Status: DC | PRN

## 2020-06-09 MED ORDER — DIPHENHYDRAMINE HCL 50 MG/ML IJ SOLN: 50.0000 mg | Freq: Once | INTRAMUSCULAR | Status: DC | PRN

## 2020-06-09 MED ORDER — METHYLPREDNISOLONE SODIUM SUCC 125 MG IJ SOLR: 125.0000 mg | Freq: Once | INTRAMUSCULAR | Status: DC | PRN

## 2020-06-09 MED ORDER — EPINEPHRINE 0.3 MG/0.3ML IJ SOAJ: 0.3000 mg | Freq: Once | INTRAMUSCULAR | Status: DC | PRN

## 2020-06-09 NOTE — Discharge Instructions (Signed)
10 Things You Can Do to Manage Your COVID-19 Symptoms at Home If you have possible or confirmed COVID-19: 1. Stay home from work and school. And stay away from other public places. If you must go out, avoid using any kind of public transportation, ridesharing, or taxis. 2. Monitor your symptoms carefully. If your symptoms get worse, call your healthcare provider immediately. 3. Get rest and stay hydrated. 4. If you have a medical appointment, call the healthcare provider ahead of time and tell them that you have or may have COVID-19. 5. For medical emergencies, call 911 and notify the dispatch personnel that you have or may have COVID-19. 6. Cover your cough and sneezes with a tissue or use the inside of your elbow. 7. Wash your hands often with soap and water for at least 20 seconds or clean your hands with an alcohol-based hand sanitizer that contains at least 60% alcohol. 8. As much as possible, stay in a specific room and away from other people in your home. Also, you should use a separate bathroom, if available. If you need to be around other people in or outside of the home, wear a mask. 9. Avoid sharing personal items with other people in your household, like dishes, towels, and bedding. 10. Clean all surfaces that are touched often, like counters, tabletops, and doorknobs. Use household cleaning sprays or wipes according to the label instructions. cdc.gov/coronavirus 12/22/2018 This information is not intended to replace advice given to you by your health care provider. Make sure you discuss any questions you have with your health care provider. Document Revised: 05/26/2019 Document Reviewed: 05/26/2019 Elsevier Patient Education  2020 Elsevier Inc. What types of side effects do monoclonal antibody drugs cause?  Common side effects  In general, the more common side effects caused by monoclonal antibody drugs include: . Allergic reactions, such as hives or itching . Flu-like signs and  symptoms, including chills, fatigue, fever, and muscle aches and pains . Nausea, vomiting . Diarrhea . Skin rashes . Low blood pressure   The CDC is recommending patients who receive monoclonal antibody treatments wait at least 90 days before being vaccinated.  Currently, there are no data on the safety and efficacy of mRNA COVID-19 vaccines in persons who received monoclonal antibodies or convalescent plasma as part of COVID-19 treatment. Based on the estimated half-life of such therapies as well as evidence suggesting that reinfection is uncommon in the 90 days after initial infection, vaccination should be deferred for at least 90 days, as a precautionary measure until additional information becomes available, to avoid interference of the antibody treatment with vaccine-induced immune responses. If you have any questions or concerns after the infusion please call the Advanced Practice Provider on call at [phone]. This number is ONLY intended for your use regarding questions or concerns about the infusion post-treatment side-effects.  Please do not provide this number to others for use. For return to work notes please contact your primary care provider.   If someone you know is interested in receiving treatment please have them call the COVID hotline at [phone].   

## 2020-06-09 NOTE — Progress Notes (Signed)
Patient reviewed Fact Sheet for Patients, Parents, and Caregivers for Emergency Use Authorization (EUA) of bamlanivimab and etesevimab for the Treatment of Coronavirus. Patient also reviewed and is agreeable to the estimated cost of treatment. Patient is agreeable to proceed.   

## 2020-06-09 NOTE — Progress Notes (Signed)
  Diagnosis: COVID-19  Physician: Dr. Patrick Wright  Procedure: Covid Infusion Clinic Med: bamlanivimab\etesevimab infusion - Provided patient with bamlanimivab\etesevimab fact sheet for patients, parents and caregivers prior to infusion.  Complications: No immediate complications noted.  Discharge: Discharged home   Allen Castillo 06/09/2020  

## 2020-06-25 ENCOUNTER — Other Ambulatory Visit: Payer: Self-pay | Admitting: Physician Assistant

## 2020-06-25 DIAGNOSIS — G8929 Other chronic pain: Secondary | ICD-10-CM

## 2020-06-25 DIAGNOSIS — M549 Dorsalgia, unspecified: Secondary | ICD-10-CM

## 2020-06-25 DIAGNOSIS — M62838 Other muscle spasm: Secondary | ICD-10-CM

## 2020-09-14 ENCOUNTER — Other Ambulatory Visit: Payer: Self-pay | Admitting: Physician Assistant

## 2020-09-14 DIAGNOSIS — E7800 Pure hypercholesterolemia, unspecified: Secondary | ICD-10-CM

## 2020-09-19 ENCOUNTER — Other Ambulatory Visit: Payer: 59

## 2020-09-19 ENCOUNTER — Other Ambulatory Visit: Payer: Self-pay

## 2020-09-19 DIAGNOSIS — E7800 Pure hypercholesterolemia, unspecified: Secondary | ICD-10-CM

## 2020-09-20 LAB — LIPID PANEL
Chol/HDL Ratio: 5.5 ratio — ABNORMAL HIGH (ref 0.0–5.0)
Cholesterol, Total: 221 mg/dL — ABNORMAL HIGH (ref 100–199)
HDL: 40 mg/dL (ref >39)
LDL Chol Calc (NIH): 152 mg/dL — ABNORMAL HIGH (ref 0–99)
Triglycerides: 161 mg/dL — ABNORMAL HIGH (ref 0–149)
VLDL Cholesterol Cal: 29 mg/dL (ref 5–40)

## 2021-02-14 DIAGNOSIS — H35363 Drusen (degenerative) of macula, bilateral: Secondary | ICD-10-CM | POA: Diagnosis not present

## 2021-02-14 DIAGNOSIS — H2513 Age-related nuclear cataract, bilateral: Secondary | ICD-10-CM | POA: Diagnosis not present

## 2021-02-14 DIAGNOSIS — H35013 Changes in retinal vascular appearance, bilateral: Secondary | ICD-10-CM | POA: Diagnosis not present

## 2021-02-14 DIAGNOSIS — H401131 Primary open-angle glaucoma, bilateral, mild stage: Secondary | ICD-10-CM | POA: Diagnosis not present

## 2021-10-21 IMAGING — CR DG LUMBAR SPINE COMPLETE 4+V
5 series · 5 of 5 positions shown · non-contrast
Comparison: None.

CLINICAL DATA: Chronic back pain.

EXAM:
LUMBAR SPINE - COMPLETE 4+ VIEW

[t l-spine a.p.]
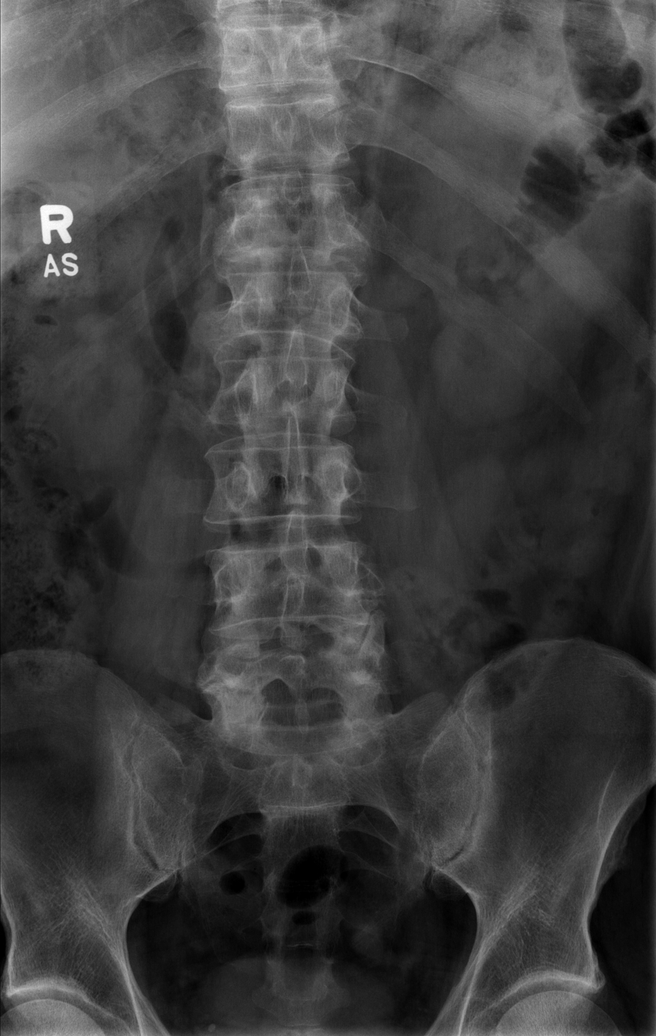

[t l-spine oblique exposure (1 of 2)]
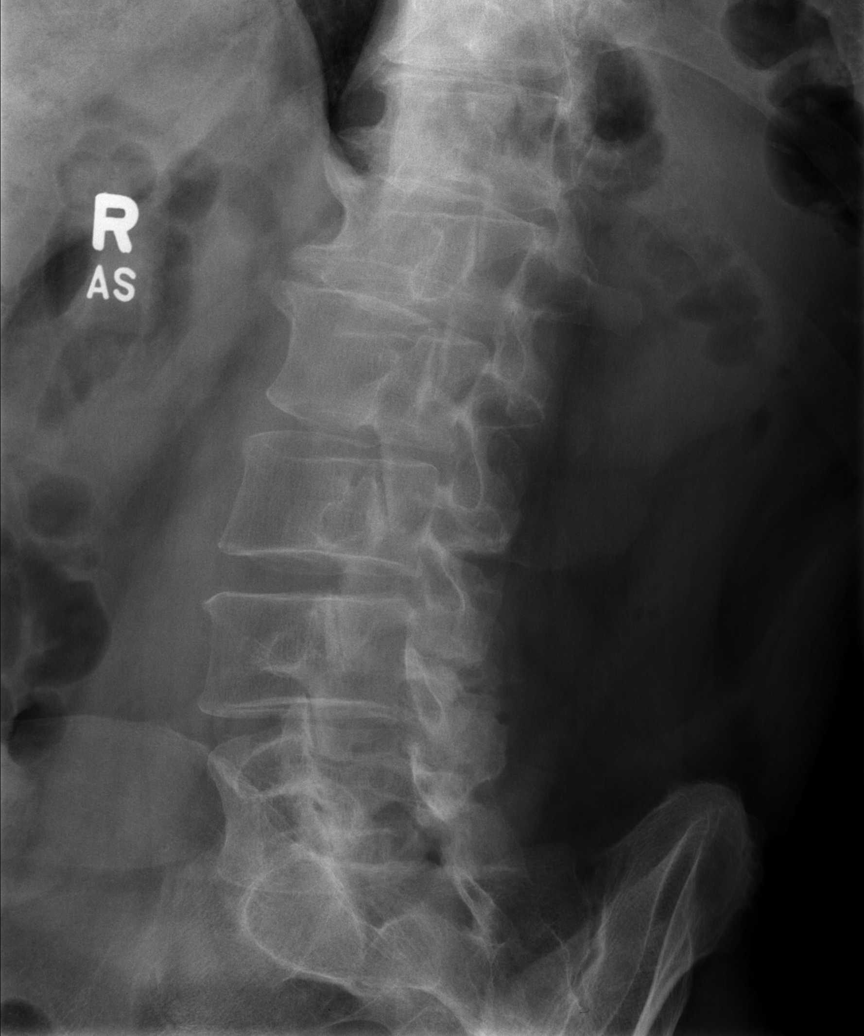

[t l-spine oblique exposure (2 of 2)]
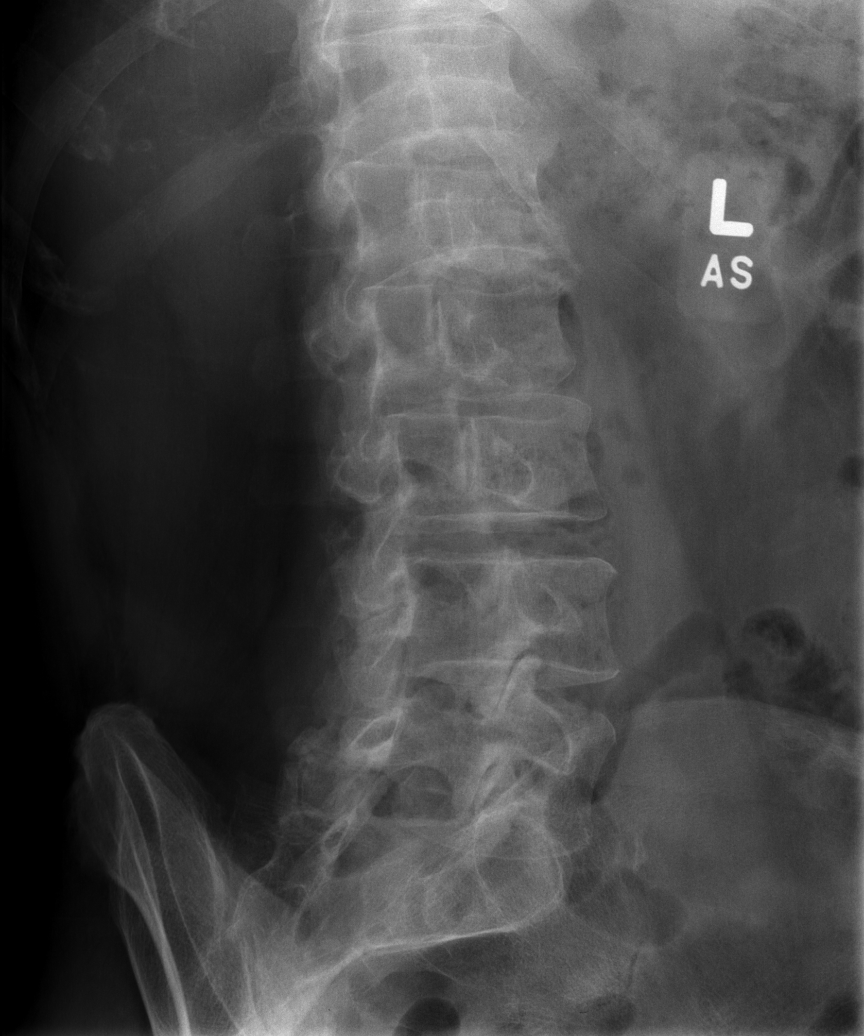

[t l-spine lat]
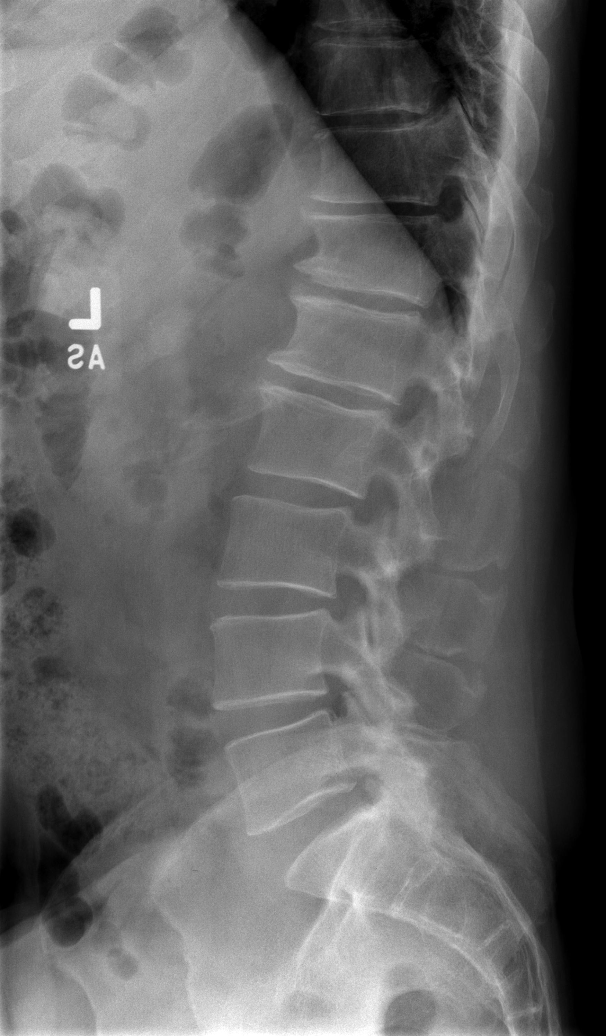

[t l-spine l5-s1 spot]
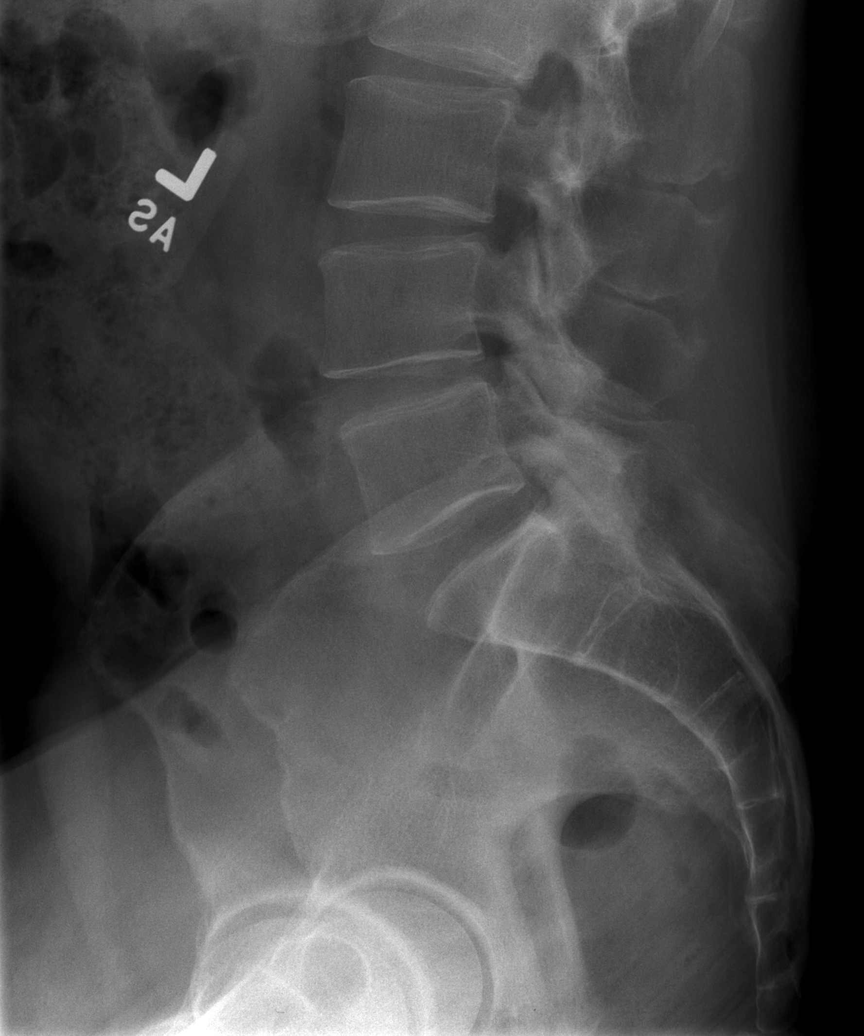

[5 of 5 positions shown; findings below may reference images not displayed]

FINDINGS: Five lumbar type vertebral bodies. No acute fracture or subluxation.
Mild anterior wedging of T12 vertebral body appears chronic.
Remaining vertebral body heights are preserved.

Trace retrolisthesis at L2-L3. Mild disc height loss at T12-L1 and
L1-L2. Remaining intervertebral disc spaces are maintained. Mild
facet arthropathy at L4-L5 and L5-S1.

Mild degenerative changes of the left sacroiliac joint.
IMPRESSION: 1. Mild degenerative changes of the lumbar spine. No acute osseous
abnormality.
2. Chronic appearing mild T12 compression deformity.

## 2021-10-21 IMAGING — CR DG THORACIC SPINE 3V
3 series · 3 of 3 positions shown · non-contrast
Comparison: None.

CLINICAL DATA: Chronic back pain.

EXAM:
THORACIC SPINE - 3 VIEWS

[t t-spine a.p. *]
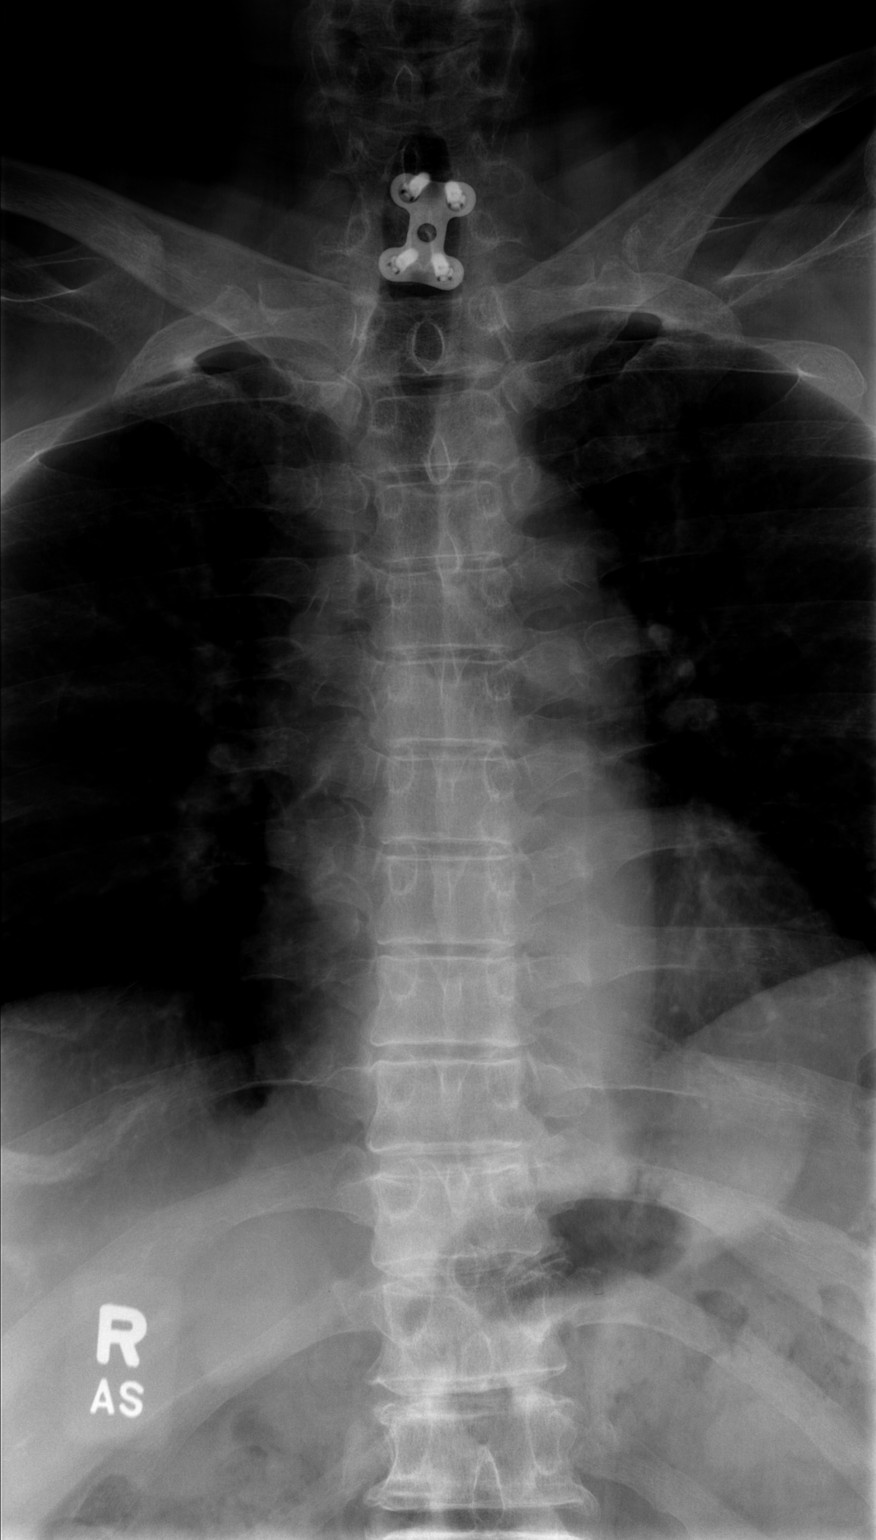

[t t-spine lat]
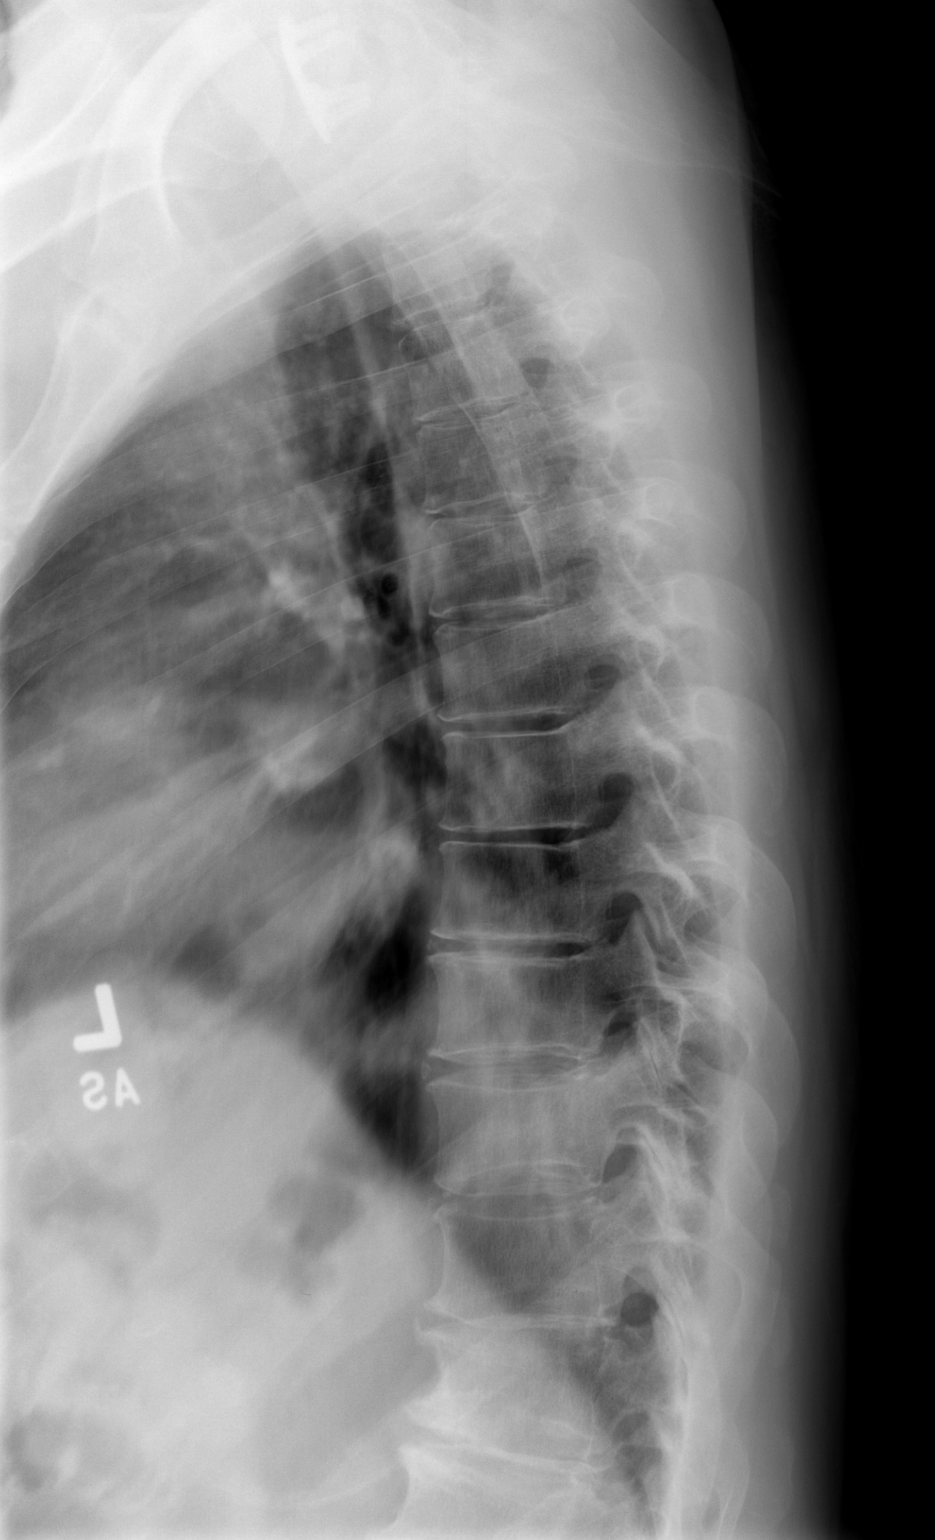

[t swimmers]
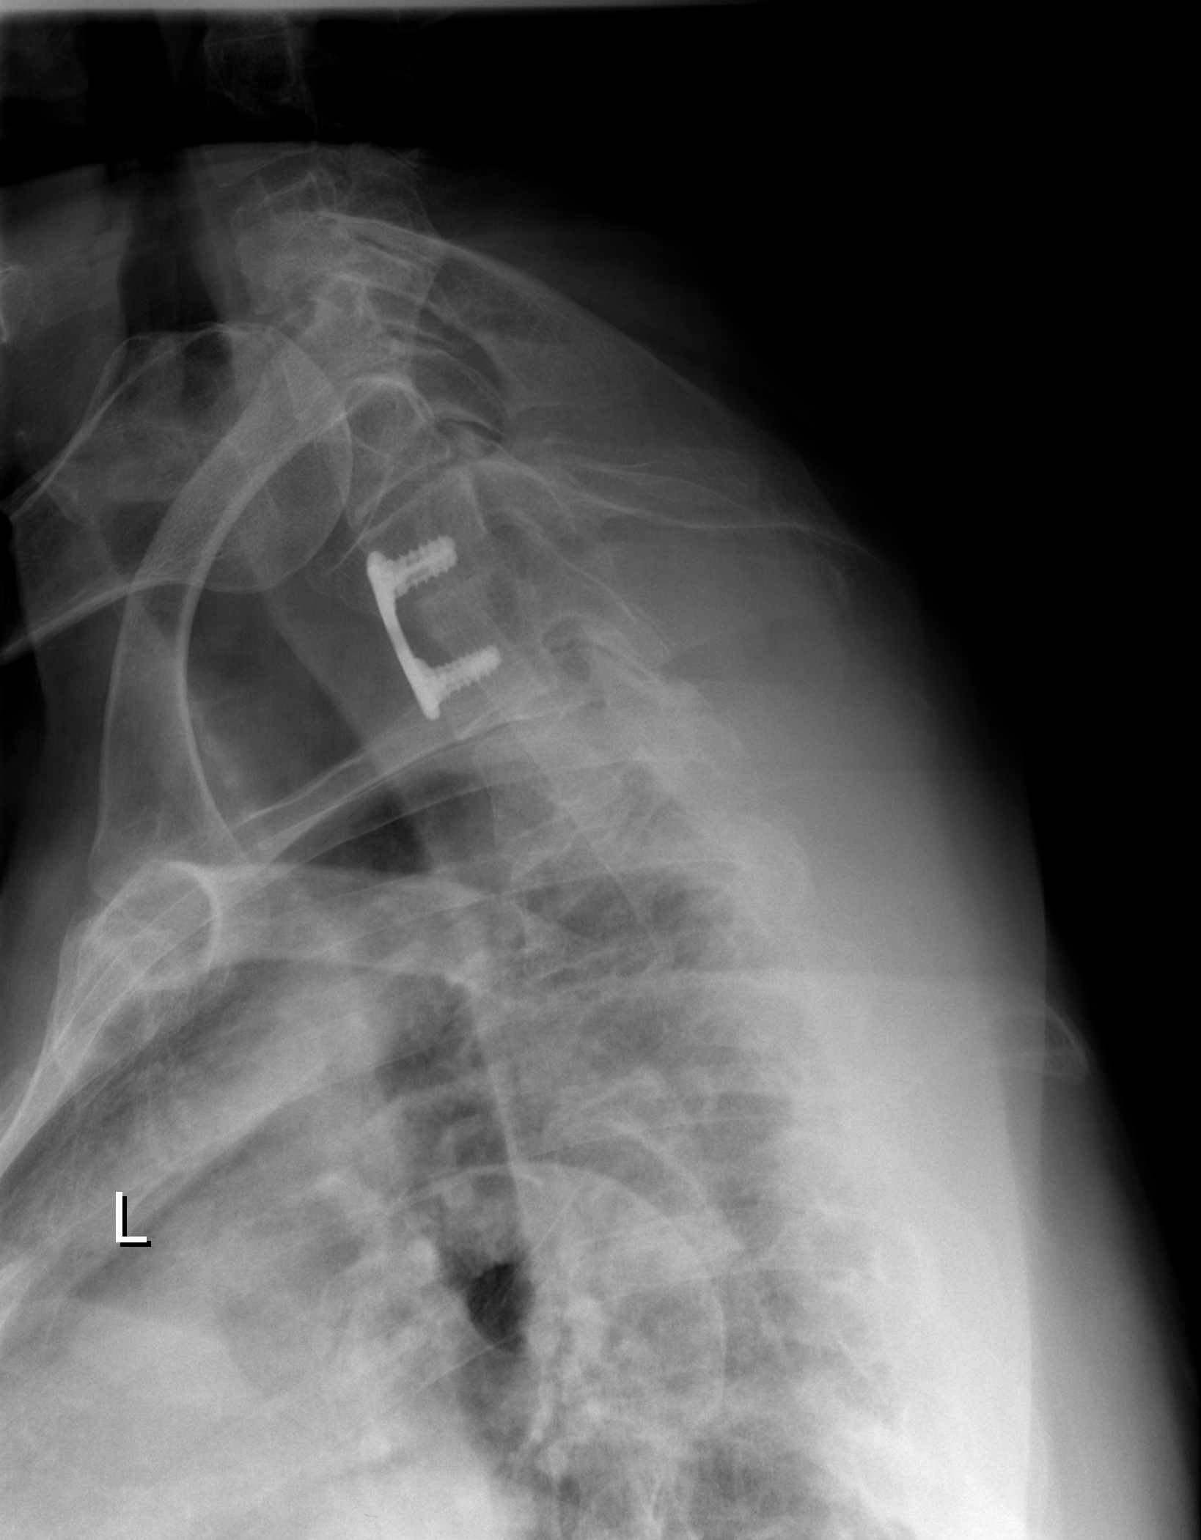

[3 of 3 positions shown; findings below may reference images not displayed]

FINDINGS: Twelve rib-bearing thoracic vertebral bodies. No acute fracture or
subluxation. Mild anterior wedging of the T12 vertebral body appears
chronic. Remaining vertebral body heights are preserved.

Alignment is normal. Mild anterior endplate spurring in the lower
thoracic spine. Intervertebral disc spaces are relatively
maintained. Prior C6-C7 ACDF.
IMPRESSION: 1. Mild lower thoracic spondylosis. No acute osseous abnormality.
2. Chronic appearing mild T12 compression deformity.

## 2022-08-07 DIAGNOSIS — H04123 Dry eye syndrome of bilateral lacrimal glands: Secondary | ICD-10-CM | POA: Diagnosis not present

## 2022-08-07 DIAGNOSIS — H2513 Age-related nuclear cataract, bilateral: Secondary | ICD-10-CM | POA: Diagnosis not present

## 2022-08-07 DIAGNOSIS — H401131 Primary open-angle glaucoma, bilateral, mild stage: Secondary | ICD-10-CM | POA: Diagnosis not present

## 2022-08-07 DIAGNOSIS — H43821 Vitreomacular adhesion, right eye: Secondary | ICD-10-CM | POA: Diagnosis not present

## 2022-08-08 ENCOUNTER — Encounter: Payer: Self-pay | Admitting: Family Medicine

## 2022-10-03 ENCOUNTER — Ambulatory Visit (INDEPENDENT_AMBULATORY_CARE_PROVIDER_SITE_OTHER): Payer: No Typology Code available for payment source | Admitting: Gastroenterology

## 2022-10-03 ENCOUNTER — Encounter: Payer: Self-pay | Admitting: Gastroenterology

## 2022-10-03 VITALS — BP 130/80 | HR 80 | Ht 71.0 in | Wt 195.0 lb

## 2022-10-03 DIAGNOSIS — K219 Gastro-esophageal reflux disease without esophagitis: Secondary | ICD-10-CM | POA: Diagnosis not present

## 2022-10-03 DIAGNOSIS — Z1211 Encounter for screening for malignant neoplasm of colon: Secondary | ICD-10-CM | POA: Diagnosis not present

## 2022-10-03 DIAGNOSIS — R142 Eructation: Secondary | ICD-10-CM

## 2022-10-03 DIAGNOSIS — K5900 Constipation, unspecified: Secondary | ICD-10-CM

## 2022-10-03 DIAGNOSIS — R195 Other fecal abnormalities: Secondary | ICD-10-CM

## 2022-10-03 MED ORDER — CLENPIQ 10-3.5-12 MG-GM -GM/175ML PO SOLN: 1.0000 | Freq: Once | ORAL | 0 refills | Status: AC

## 2022-10-03 MED ORDER — ESOMEPRAZOLE MAGNESIUM 20 MG PO CPDR: 20.0000 mg | DELAYED_RELEASE_CAPSULE | Freq: Every day | ORAL | 1 refills | Status: AC

## 2022-10-03 NOTE — Progress Notes (Signed)
Chief Complaint: Constipation, abdominal bloating, belching    HPI:     Allen Castillo is a 62 y.o. male presenting [facility] for evaluation of constipation, change in bowel habits, abdominal bloating, belching, and CRC screening.  Sxs started 3 weeks ago with constipation, bloating.  Thinks this may have started with some increased stress at home.  Tried dulcolax with improvement, but sxs recurred.  Again took Dulcolax yesterday with improvement.  No prior similar sxs. Baseline is 1-2 formed stools daily. No hematochezia, melena.   Separately with belching for several months. Has decreased soda consumption with some improvement. Has had nocturnal HB in the past, which he treats with prn Nexium. Worse with tomato-based sauces, spicy foods. No dysphagia. No prior EGD.    No known family history of CRC, GI malignancy, liver disease, pancreatic disease, or IBD.    Reviewed most recent pertinent imaging and labs as below: - 04/18/2017: Abdominal ultrasound (indication right flank pain): GB polyps measuring up to 5 mm, otherwise normal - 01/2020: Normal CBC, CMP, TSH  Endoscopic History: - 11/20/2017: Colonoscopy [facility]): 5 mm sigmoid polyp (path: Unremarkable colonic mucosa), left-sided diverticulosis, grade 1 internal hemorrhoids.  Repeat in 5 years   Past Medical History:  Diagnosis Date   Depression    Glaucoma      Past Surgical History:  Procedure Laterality Date   SPINE SURGERY     Family History  Problem Relation Age of Onset   Healthy Mother    Healthy Father    Healthy Brother    Liver disease Neg Hx    Esophageal cancer Neg Hx    Colon cancer Neg Hx    Social History   Tobacco Use   Smoking status: Never   Smokeless tobacco: Never  Vaping Use   Vaping Use: Never used  Substance Use Topics   Alcohol use: Never   Drug use: Never   Current Outpatient Medications  Medication Sig Dispense Refill    baclofen (LIORESAL) 10 MG tablet TAKE 1 TABLET BY MOUTH 2 TIMES DAILY AS NEEDED FOR MUSCLE SPASMS. 60 tablet 0   fluticasone (FLONASE) 50 MCG/ACT nasal spray 1 spray each nostril following sinus rinses twice daily 16 g 2   latanoprost (XALATAN) 0.005 % ophthalmic solution Place 1 drop into both eyes daily.     levocetirizine (XYZAL) 5 MG tablet Take 1 tablet (5 mg total) by mouth every evening. 90 tablet 0   metroNIDAZOLE (METROGEL) 0.75 % gel Apply 1 application topically 2 (two) times daily. 45 g 2   montelukast (SINGULAIR) 10 MG tablet Take 1 tablet (10 mg total) by mouth at bedtime. 90 tablet 0   Olopatadine HCl (PATADAY) 0.2 % SOLN 1 drop each eye daily for itch/ watery eyes due to allergies 10 mL 0   Omega-3 Fatty Acids (FISH OIL BURP-LESS PO) Take 1 capsule by mouth daily.     saw palmetto 500 MG capsule Take 500 mg by mouth daily.     timolol (TIMOPTIC) 0.5 % ophthalmic solution      tretinoin (RETIN-A) 0.025 % cream Apply 1 application  topically at bedtime.     triamcinolone cream (KENALOG) 0.1 % SMARTSIG:1 Application Topical 2-3 Times Daily     No current facility-administered medications for this visit.   No Known Allergies   Review of Systems: All systems reviewed and negative except where noted in HPI.  Physical Exam:    Wt Readings from Last 3 Encounters:  10/03/22 195 lb (88.5 kg)  03/22/20 189 lb 11.2 oz (86 kg)  01/24/20 193 lb 1.6 oz (87.6 kg)    BP 130/80   Pulse 80   Ht  (1.803 m)   Wt 195 lb (88.5 kg)   BMI 27.20 kg/m  Constitutional:  Pleasant, in no acute distress. Psychiatric: Normal mood and affect. Behavior is normal. Cardiovascular: Normal rate, regular rhythm. No edema Pulmonary/chest: Effort normal and breath sounds normal. No wheezing, rales or rhonchi. Abdominal: Soft, nondistended, nontender. Bowel sounds active throughout. There are no masses palpable. No hepatomegaly. Neurological: Alert and oriented to person place and  time. Skin: Skin is warm and dry. No rashes noted.   ASSESSMENT AND PLAN;   1) GERD 2) Belching Seemingly longstanding history of GERD which she had controlled mainly with dietary avoidance and on demand Nexium, but recent increase in atypical symptoms (belching). - EGD indicated to evaluate new/worsening reflux in this 62 yo Caucasian male per guidelines - Barrett's Esophagus screening - Evaluate for erosive esophagtis, LES laxity, HH at time of EGD - Start Nexium 20 mg QPM given predominance of nocturnal symptoms - Avoid eating within 3 hours of bedtime  3) Change in bowel habits 4) Constipation - Start Miralax 1 cap daily and titrate to soft stools, or stop completely if/when sxs resolve - Evaluate for mucosal/luminal pathology at time of colonoscopy  5) Colon cancer screening - Per previous Endoscopist, due for repeat colonoscopy for ongoing screening - Scheduling colonoscopy as above   The indications, risks, and benefits of EGD and colonoscopy were explained to the patient in detail. Risks include but are not limited to bleeding, perforation, adverse reaction to medications, and cardiopulmonary compromise. Sequelae include but are not limited to the possibility of surgery, hospitalization, and mortality. The patient verbalized understanding and wished to proceed. All questions answered, referred to scheduler and bowel prep ordered. Further recommendations pending results of the exam.    Verlin Dike Kimra Kantor, DO, FACG  10/03/2022, 3:35 PM   No ref. provider found

## 2022-10-03 NOTE — Patient Instructions (Addendum)
We have sent the following medications to your pharmacy for you to pick up at your convenience:  Nexium 20 MG daily at bedtime.  Take Miralax 1 capful with 8 oz water daily.  You have been scheduled for an endoscopy and colonoscopy. Please follow the written instructions given to you at your visit today. Please pick up your prep supplies at the pharmacy within the next 1-3 days. If you use inhalers (even only as needed), please bring them with you on the day of your procedure.   _______________________________________________________  If your blood pressure at your visit was 140/90 or greater, please contact your primary care physician to follow up on this.  _______________________________________________________  If you are age 64 or younger, your body mass index should be between 19-25. Your Body mass index is 27.2 kg/m. If this is out of the aformentioned range listed, please consider follow up with your Primary Care Provider.   __________________________________________________________  The [facility] providers would like to encourage you to use [facility] to communicate with providers for non-urgent requests or questions.  Due to long hold times on the telephone, sending your provider a message by [facility] may be a faster and more efficient way to get a response.  Please allow 48 business hours for a response.  Please remember that this is for non-urgent requests.   Due to recent changes in healthcare laws, you may see the results of your imaging and laboratory studies on MyChart before your provider has had a chance to review them.  We understand that in some cases there may be results that are confusing or concerning to you. Not all laboratory results come back in the same time frame and the provider may be waiting for multiple results in order to interpret others.  Please give [place] 48 hours in order for your provider to thoroughly review all the results before contacting the office for  clarification of your results.    Thank you for choosing me and [facility].  Vito Cirigliano, D.O.

## 2022-10-15 ENCOUNTER — Other Ambulatory Visit: Payer: Self-pay

## 2022-10-15 ENCOUNTER — Ambulatory Visit (HOSPITAL_COMMUNITY)
Admission: EM | Admit: 2022-10-15 | Discharge: 2022-10-15 | Disposition: A | Discharge status: Home / Self Care | Payer: No Typology Code available for payment source | Attending: Emergency Medicine | Admitting: Emergency Medicine

## 2022-10-15 ENCOUNTER — Encounter (HOSPITAL_COMMUNITY): Payer: Self-pay | Admitting: *Deleted

## 2022-10-15 ENCOUNTER — Ambulatory Visit (INDEPENDENT_AMBULATORY_CARE_PROVIDER_SITE_OTHER): Payer: No Typology Code available for payment source

## 2022-10-15 DIAGNOSIS — J069 Acute upper respiratory infection, unspecified: Secondary | ICD-10-CM

## 2022-10-15 DIAGNOSIS — J40 Bronchitis, not specified as acute or chronic: Secondary | ICD-10-CM

## 2022-10-15 MED ORDER — METHYLPREDNISOLONE SODIUM SUCC 125 MG IJ SOLR
INTRAMUSCULAR | Status: AC
  Filled 2022-10-15: qty 2

## 2022-10-15 MED ORDER — PREDNISONE 20 MG PO TABS: 40.0000 mg | ORAL_TABLET | Freq: Every day | ORAL | 0 refills | Status: AC

## 2022-10-15 MED ORDER — IPRATROPIUM-ALBUTEROL 0.5-2.5 (3) MG/3ML IN SOLN
3.0000 mL | Freq: Once | RESPIRATORY_TRACT | Status: AC
  Administered 2022-10-15: 3 mL via RESPIRATORY_TRACT

## 2022-10-15 MED ORDER — DOXYCYCLINE HYCLATE 100 MG PO CAPS: 100.0000 mg | ORAL_CAPSULE | Freq: Two times a day (BID) | ORAL | 0 refills | Status: AC

## 2022-10-15 MED ORDER — IPRATROPIUM-ALBUTEROL 0.5-2.5 (3) MG/3ML IN SOLN
RESPIRATORY_TRACT | Status: AC
  Filled 2022-10-15: qty 3

## 2022-10-15 MED ORDER — METHYLPREDNISOLONE SODIUM SUCC 125 MG IJ SOLR
60.0000 mg | Freq: Once | INTRAMUSCULAR | Status: AC
  Administered 2022-10-15: 60 mg via INTRAMUSCULAR

## 2022-10-15 MED ORDER — ALBUTEROL SULFATE HFA 108 (90 BASE) MCG/ACT IN AERS: 1.0000 | INHALATION_SPRAY | Freq: Four times a day (QID) | RESPIRATORY_TRACT | 0 refills | Status: DC | PRN

## 2022-10-15 MED ORDER — AMOXICILLIN 500 MG PO CAPS: 1000.0000 mg | ORAL_CAPSULE | Freq: Three times a day (TID) | ORAL | 0 refills | Status: AC

## 2022-10-15 NOTE — ED Provider Notes (Signed)
[facility]    CSN: [id number] Arrival date & time: 10/15/22  1746      History   Chief Complaint Chief Complaint  Patient presents with   Cough   Wheezing   Shortness of Breath    HPI Allen Castillo is a 62 y.o. male.   Patient presents to clinic for wheezing, cough, and shortness of breath.  About 2 weeks ago he noticed that something similar happened with wheezing and a cough, he was up multiple nights in a row where he cannot get comfortable.  Since then, he has been taking Xyzal and Singulair but these have not been helping.  At work earlier today he was fine, he came home and was watching his dog play with his neighbor's dog outside when he noticed that he was short of breath and wheezing.  Wife reports audible wheezing.  Patient denies any fever, reports he is fatigued.  Denies any lower extremity edema.  He does not smoke, denies any history of asthma or respiratory issues.  He did move to [place] from [place] about 5 years ago.  Never had seasonal allergies prior to the move.   The history is provided by the patient and medical records.  Cough Associated symptoms: rhinorrhea, shortness of breath and wheezing   Associated symptoms: no chest pain, no chills, no eye discharge, no fever and no sore throat   Wheezing Associated symptoms: cough, fatigue, rhinorrhea and shortness of breath   Associated symptoms: no chest pain, no fever and no sore throat   Shortness of Breath Associated symptoms: cough and wheezing   Associated symptoms: no abdominal pain, no chest pain, no fever and no sore throat     Past Medical History:  Diagnosis Date   Depression    Glaucoma     Patient Active Problem List   Diagnosis Date Noted   Plantar fasciitis 05/15/2019   Elevated BP without diagnosis of hypertension 03/22/2019   Elevated LDL cholesterol level 02/22/2019   Rosacea 10/27/2018   Glaucoma 10/27/2018   Healthcare maintenance 10/27/2018    Past  Surgical History:  Procedure Laterality Date   SPINE SURGERY         Home Medications    Prior to Admission medications   Medication Sig Start Date End Date Taking? Authorizing Provider  albuterol (VENTOLIN HFA) 108 (90 Base) MCG/ACT inhaler Inhale 1-2 puffs into the lungs every 6 (six) hours as needed for wheezing or shortness of breath. 10/15/22  Yes Rinaldo Ratel, [place] N, FNP  amoxicillin (AMOXIL) 500 MG capsule Take 2 capsules (1,000 mg total) by mouth in the morning, at noon, and at bedtime for 5 days. 10/15/22 10/20/22 Yes Rinaldo Ratel, [place] N, FNP  doxycycline (VIBRAMYCIN) 100 MG capsule Take 1 capsule (100 mg total) by mouth 2 (two) times daily for 5 days. 10/15/22 10/20/22 Yes Rinaldo Ratel, [place] N, FNP  predniSONE (DELTASONE) 20 MG tablet Take 2 tablets (40 mg total) by mouth daily with breakfast for 5 days. 10/15/22 10/20/22 Yes Rinaldo Ratel, [place] N, FNP  baclofen (LIORESAL) 10 MG tablet TAKE 1 TABLET BY MOUTH 2 TIMES DAILY AS NEEDED FOR MUSCLE SPASMS. 06/25/20   Mayer Masker, PA-C  esomeprazole (NEXIUM) 20 MG capsule Take 1 capsule (20 mg total) by mouth at bedtime. 10/03/22   Cirigliano, Vito V, DO  fluticasone (FLONASE) 50 MCG/ACT nasal spray 1 spray each nostril following sinus rinses twice daily 10/04/19   Opalski, Deborah, DO  latanoprost (XALATAN) 0.005 % ophthalmic solution Place 1 drop into  both eyes daily. 10/26/18   [provider]  levocetirizine (XYZAL) 5 MG tablet Take 1 tablet (5 mg total) by mouth every evening. 10/04/19   Opalski, Gavin Pound, DO  metroNIDAZOLE (METROGEL) 0.75 % gel Apply 1 application topically 2 (two) times daily. 10/27/18   Danford, Orpha Bur D, NP  montelukast (SINGULAIR) 10 MG tablet Take 1 tablet (10 mg total) by mouth at bedtime. 10/04/19   Opalski, Gavin Pound, DO  Olopatadine HCl (PATADAY) 0.2 % SOLN 1 drop each eye daily for itch/ watery eyes due to allergies 10/04/19   Thomasene Lot, DO  Omega-3 Fatty Acids (FISH OIL BURP-LESS PO) Take 1 capsule by mouth  daily.    [provider]  saw palmetto 500 MG capsule Take 500 mg by mouth daily.    [provider]  timolol (TIMOPTIC) 0.5 % ophthalmic solution  01/15/19   [provider]  tretinoin (RETIN-A) 0.025 % cream Apply 1 application  topically at bedtime. 08/22/19   [provider]  triamcinolone cream (KENALOG) 0.1 % SMARTSIG:1 Application Topical 2-3 Times Daily 08/22/19   [provider]    Family History Family History  Problem Relation Age of Onset   Healthy Mother    Healthy Father    Healthy Brother    Liver disease Neg Hx    Esophageal cancer Neg Hx    Colon cancer Neg Hx     Social History Social History   Tobacco Use   Smoking status: Never   Smokeless tobacco: Never  Vaping Use   Vaping Use: Never used  Substance Use Topics   Alcohol use: Never   Drug use: Never     Allergies   Patient has no known allergies.   Review of Systems Review of Systems  Constitutional:  Positive for fatigue. Negative for chills and fever.  HENT:  Positive for congestion, postnasal drip and rhinorrhea. Negative for sore throat.   Eyes:  Negative for discharge and itching.  Respiratory:  Positive for cough, shortness of breath and wheezing.   Cardiovascular:  Negative for chest pain and leg swelling.  Gastrointestinal:  Negative for abdominal pain.     Physical Exam Triage Vital Signs ED Triage Vitals  Enc Vitals Group     BP 10/15/22 1850 (!) 161/95     Pulse Rate 10/15/22 1850 77     Resp 10/15/22 1850 20     Temp 10/15/22 1850 97.9 F (36.6 C)     Temp src --      SpO2 10/15/22 1850 94 %     Weight --      Height --      Head Circumference --      Peak Flow --      Pain Score 10/15/22 1848 0     Pain Loc --      Pain Edu? --      Excl. in GC? --    No data found.  Updated Vital Signs BP (!) 161/95   Pulse 77   Temp 97.9 F (36.6 C)   Resp 20   SpO2 94%   Visual Acuity Right Eye Distance:   Left Eye Distance:    Bilateral Distance:    Right Eye Near:   Left Eye Near:    Bilateral Near:     Physical Exam Vitals and nursing note reviewed.  Constitutional:      Appearance: Normal appearance. He is well-developed.  HENT:     Head: Normocephalic and atraumatic.  Right Ear: External ear normal.     Left Ear: External ear normal.     Nose: Congestion and rhinorrhea present.     Mouth/Throat:     Mouth: Mucous membranes are moist.  Eyes:     General: No scleral icterus.    Conjunctiva/sclera: Conjunctivae normal.  Cardiovascular:     Rate and Rhythm: Normal rate and regular rhythm.     Heart sounds: Normal heart sounds. No murmur heard. Pulmonary:     Effort: Pulmonary effort is normal. No respiratory distress.     Breath sounds: Wheezing present. No decreased breath sounds.  Musculoskeletal:        General: No swelling. Normal range of motion.  Lymphadenopathy:     Cervical: Cervical adenopathy present.  Skin:    General: Skin is warm and dry.  Neurological:     General: No focal deficit present.     Mental Status: He is alert and oriented to person, place, and time.  Psychiatric:        Mood and Affect: Mood normal.        Behavior: Behavior normal.      UC Treatments / Results  Labs (all labs ordered are listed, but only abnormal results are displayed) Labs Reviewed - No data to display  EKG   Radiology DG Chest 2 View  Result Date: 10/15/2022 CLINICAL DATA:  Shortness of breath, cough, and wheezing. Seasonal allergies. EXAM: CHEST - 2 VIEW COMPARISON:  None Available. FINDINGS: Normal heart size and pulmonary vascularity. No focal airspace disease or consolidation in the lungs. No blunting of costophrenic angles. No pneumothorax. Mediastinal contours appear intact. Postoperative changes in the cervical spine. IMPRESSION: No active cardiopulmonary disease. Electronically Signed   By: Burman Nieves M.D.   On: 10/15/2022 19:54    Procedures Procedures (including  critical care time)  Medications Ordered in UC Medications  methylPREDNISolone sodium succinate (SOLU-MEDROL) 125 mg/2 mL injection 60 mg (has no administration in time range)  ipratropium-albuterol (DUONEB) 0.5-2.5 (3) MG/3ML nebulizer solution 3 mL (3 mLs Nebulization Given 10/15/22 1927)    Initial Impression / Assessment and Plan / UC Course  I have reviewed the triage vital signs and the nursing notes.  Pertinent labs & imaging results that were available during my care of the patient were reviewed by me and considered in my medical decision making (see chart for details).  Vitals and triage reviewed, patient is hemodynamically stable.  Oxygenation in the low 90s on room air, lungs vesicular posteriorly, chest x-ray obtained and was negative for infiltration.  Suspect rebound of symptoms from two weeks prior, will cover with dual abx therapy.  After DuoNeb patient reportedly feels better, oxygenation remaining at 94%.  No acute distress.  Will cover for atypical pneumonia/bronchitis/asthma/wheezing.  As needed albuterol inhaler and steroids for the next 5 days.  Strict emergency room return precautions given, patient verbalized understanding.  No questions at this time.     Final Clinical Impressions(s) / UC Diagnoses   Final diagnoses:  Bronchitis  Upper respiratory tract infection, unspecified type     Discharge Instructions      Your chest x-ray was negative for pneumonia.  Due to the duration of your symptoms, I am covering you with dual antibiotic therapy.  You can use your inhaler as needed for wheezing or shortness of breath every 6 hours.  Please also start the steroids tomorrow with breakfast.  Please seek immediate care if you develop worsening shortness of breath, chest pain, new  onset fever, or any new concerning symptoms.     ED Prescriptions     Medication Sig Dispense Auth. Provider   amoxicillin (AMOXIL) 500 MG capsule Take 2 capsules (1,000 mg total) by  mouth in the morning, at noon, and at bedtime for 5 days. 30 capsule Rinaldo Ratel, [place] N, [place]   doxycycline (VIBRAMYCIN) 100 MG capsule Take 1 capsule (100 mg total) by mouth 2 (two) times daily for 5 days. 10 capsule Rinaldo Ratel, [place] N, FNP   albuterol (VENTOLIN HFA) 108 (90 Base) MCG/ACT inhaler Inhale 1-2 puffs into the lungs every 6 (six) hours as needed for wheezing or shortness of breath. 18 g Rinaldo Ratel, [place] N, [place]   predniSONE (DELTASONE) 20 MG tablet Take 2 tablets (40 mg total) by mouth daily with breakfast for 5 days. 10 tablet Brentton Wardlow, [place] N, FNP      PDMP not reviewed this encounter.   Quinnten Calvin, [place] N, [place] 10/15/22 2009

## 2022-10-15 NOTE — Discharge Instructions (Signed)
Your chest x-ray was negative for pneumonia.  Due to the duration of your symptoms, I am covering you with dual antibiotic therapy.  You can use your inhaler as needed for wheezing or shortness of breath every 6 hours.  Please also start the steroids tomorrow with breakfast.  Please seek immediate care if you develop worsening shortness of breath, chest pain, new onset fever, or any new concerning symptoms.

## 2022-10-15 NOTE — ED Triage Notes (Signed)
Pt reports wheezing,cough and SHOB. Pt reports seasonal allergies Sx's started yesterday after he was standing out side.. Pt had same Sx's 2 weeks ago. Pt used all meds he had a t home.

## 2022-11-01 ENCOUNTER — Encounter: Payer: Self-pay | Admitting: Certified Registered Nurse Anesthetist

## 2022-11-06 ENCOUNTER — Ambulatory Visit (AMBULATORY_SURGERY_CENTER): Payer: No Typology Code available for payment source | Admitting: Gastroenterology

## 2022-11-06 ENCOUNTER — Encounter: Payer: Self-pay | Admitting: Gastroenterology

## 2022-11-06 VITALS — BP 113/77 | HR 77 | Temp 98.9°F | Resp 15 | Ht 71.0 in | Wt 195.0 lb

## 2022-11-06 DIAGNOSIS — Z09 Encounter for follow-up examination after completed treatment for conditions other than malignant neoplasm: Secondary | ICD-10-CM | POA: Diagnosis not present

## 2022-11-06 DIAGNOSIS — Z86010 Personal history of colonic polyps: Secondary | ICD-10-CM | POA: Diagnosis not present

## 2022-11-06 DIAGNOSIS — D125 Benign neoplasm of sigmoid colon: Secondary | ICD-10-CM

## 2022-11-06 DIAGNOSIS — K635 Polyp of colon: Secondary | ICD-10-CM | POA: Diagnosis not present

## 2022-11-06 DIAGNOSIS — R142 Eructation: Secondary | ICD-10-CM

## 2022-11-06 DIAGNOSIS — K5730 Diverticulosis of large intestine without perforation or abscess without bleeding: Secondary | ICD-10-CM

## 2022-11-06 DIAGNOSIS — K219 Gastro-esophageal reflux disease without esophagitis: Secondary | ICD-10-CM

## 2022-11-06 DIAGNOSIS — Z1211 Encounter for screening for malignant neoplasm of colon: Secondary | ICD-10-CM

## 2022-11-06 DIAGNOSIS — D131 Benign neoplasm of stomach: Secondary | ICD-10-CM

## 2022-11-06 DIAGNOSIS — K317 Polyp of stomach and duodenum: Secondary | ICD-10-CM | POA: Diagnosis not present

## 2022-11-06 MED ORDER — SODIUM CHLORIDE 0.9 % IV SOLN
500.0000 mL | Freq: Once | INTRAVENOUS | Status: DC
Start: 2022-11-06 — End: 2022-11-06

## 2022-11-06 NOTE — Progress Notes (Signed)
Report given to PACU, vss 

## 2022-11-06 NOTE — Op Note (Signed)
[facility] Patient Name: Allen Castillo Procedure Date: 11/06/2022 2:04 PM MRN: [id number] Endoscopist: Doristine Locks , MD, [id number] Age: 62 Referring MD:  Date of Birth: 04/05/1961 Gender: Male Account #: [id number] Procedure:                Upper GI endoscopy Indications:              Suspected esophageal reflux, Abdominal bloating,                            Eructation Medicines:                Monitored Anesthesia Care Procedure:                Pre-Anesthesia Assessment:                           - Prior to the procedure, a History and Physical                            was performed, and patient medications and                            allergies were reviewed. The patient's tolerance of                            previous anesthesia was also reviewed. The risks                            and benefits of the procedure and the sedation                            options and risks were discussed with the patient.                            All questions were answered, and informed consent                            was obtained. Prior Anticoagulants: The patient has                            taken no anticoagulant or antiplatelet agents. ASA                            Grade Assessment: II - A patient with mild systemic                            disease. After reviewing the risks and benefits,                            the patient was deemed in satisfactory condition to                            undergo the procedure.  After obtaining informed consent, the endoscope was                            passed under direct vision. Throughout the                            procedure, the patient's blood pressure, pulse, and                            oxygen saturations were monitored continuously. The                            Olympus Scope [id number] was introduced through the                            mouth, and advanced to the third part of  duodenum.                            The upper GI endoscopy was accomplished without                            difficulty. The patient tolerated the procedure                            well. Scope In: Scope Out: Findings:                 The examined esophagus was normal.                           The Z-line was regular and was found 41 cm from the                            incisors.                           Multiple small sessile polyps with no stigmata of                            recent bleeding were found in the gastric fundus                            and in the gastric body. A few of these polyps were                            removed with a cold biopsy forceps for histologic                            representative evaluation. Resection and retrieval                            were complete. Estimated blood loss was minimal.                           Normal mucosa  was found in the entire examined                            stomach.                           The examined duodenum was normal. Complications:            No immediate complications. Estimated Blood Loss:     Estimated blood loss was minimal. Impression:               - Normal esophagus.                           - Z-line regular, 41 cm from the incisors.                           - Multiple benign appearing fundic gland gastric                            polyps. Resected and retrieved.                           - Normal mucosa was found in the entire stomach.                           - Normal examined duodenum. Recommendation:           - Patient has a contact number available for                            emergencies. The signs and symptoms of potential                            delayed complications were discussed with the                            patient. Return to normal activities tomorrow.                            Written discharge instructions were provided to the                             patient.                           - Resume previous diet.                           - Continue present medications.                           - Resume esomeprazole as prescribed.                           - Await pathology results.                           -  Colonoscopy today. Doristine Locks, MD 11/06/2022 2:44:06 PM

## 2022-11-06 NOTE — Progress Notes (Signed)
Called to room to assist during endoscopic procedure.  Patient ID and intended procedure confirmed with present staff. Received instructions for my participation in the procedure from the performing physician.  

## 2022-11-06 NOTE — Op Note (Signed)
[facility] Patient Name: Allen Castillo Procedure Date: 11/06/2022 2:00 PM MRN: [id number] Endoscopist: Doristine Locks , MD, [id number] Age: 62 Referring MD:  Date of Birth: 08-08-1960 Gender: Male Account #: [id number] Procedure:                Colonoscopy Indications:              Surveillance: Personal history of colonic polyps                            (unknown histology) on last colonoscopy more than 5                            years ago                           Last colonoscopy was 11/20/2017 [facility]                            Endoscopy) and notable for 5 mm sigmoid polyp                            (path: Unremarkable colonic mucosa), left-sided                            diverticulosis, grade 1 internal hemorrhoids.                            Repeat in 5 years                           Separately, recent change in bowel habits with                            constipation, with good clinical response to trial                            of Miralax. Medicines:                Monitored Anesthesia Care Procedure:                Pre-Anesthesia Assessment:                           - Prior to the procedure, a History and Physical                            was performed, and patient medications and                            allergies were reviewed. The patient's tolerance of                            previous anesthesia was also reviewed. The risks                            and benefits of the procedure and the  sedation                            options and risks were discussed with the patient.                            All questions were answered, and informed consent                            was obtained. Prior Anticoagulants: The patient has                            taken no anticoagulant or antiplatelet agents. ASA                            Grade Assessment: II - A patient with mild systemic                            disease. After reviewing the  risks and benefits,                            the patient was deemed in satisfactory condition to                            undergo the procedure.                           After obtaining informed consent, the colonoscope                            was passed under direct vision. Throughout the                            procedure, the patient's blood pressure, pulse, and                            oxygen saturations were monitored continuously. The                            Olympus SN [id number] was introduced through the anus                            and advanced to the the terminal ileum. The                            colonoscopy was performed without difficulty. The                            patient tolerated the procedure well. The quality                            of the bowel preparation was good. The terminal  ileum, ileocecal valve, appendiceal orifice, and                            rectum were photographed. Scope In: 2:24:03 PM Scope Out: 2:37:29 PM Scope Withdrawal Time: 0 hours 10 minutes 40 seconds  Total Procedure Duration: 0 hours 13 minutes 26 seconds  Findings:                 The perianal and digital rectal examinations were                            normal.                           Two sessile polyps were found in the sigmoid colon.                            The polyps were 3 to 4 mm in size. These polyps                            were removed with a cold snare. Resection and                            retrieval were complete. Estimated blood loss was                            minimal.                           Multiple medium-mouthed and small-mouthed                            diverticula were found in the sigmoid colon.                           The exam was otherwise normal throughout the                            remainder of the colon.                           The retroflexed view of the distal rectum and anal                             verge was normal and showed no anal or rectal                            abnormalities.                           The terminal ileum appeared normal. Complications:            No immediate complications. Estimated Blood Loss:     Estimated blood loss was minimal. Impression:               - Two 3 to 4 mm polyps in the sigmoid colon,  removed with a cold snare. Resected and retrieved.                           - Diverticulosis in the sigmoid colon.                           - The distal rectum and anal verge are normal on                            retroflexion view.                           - The examined portion of the ileum was normal.                           - The GI Genius (intelligent endoscopy module),                            computer-aided polyp detection system powered by AI                            was utilized to detect colorectal polyps through                            enhanced visualization during colonoscopy. Recommendation:           - Patient has a contact number available for                            emergencies. The signs and symptoms of potential                            delayed complications were discussed with the                            patient. Return to normal activities tomorrow.                            Written discharge instructions were provided to the                            patient.                           - Resume previous diet.                           - Continue present medications.                           - Resume Miralax as needed for constipation.                           - Await pathology results.                           -  Repeat colonoscopy for surveillance based on                            pathology results.                           - Return to GI office PRN. Doristine Locks, MD 11/06/2022 2:48:17 PM

## 2022-11-06 NOTE — Patient Instructions (Addendum)
- Resume previous diet. - Continue present medications. - Resume esomeprazole as prescribed. - Await pathology results. - Resume Miralax as needed for constipation. - Repeat colonoscopy for surveillance based on pathology results. - Return to GI office PRN.  YOU HAD AN ENDOSCOPIC PROCEDURE TODAY AT THE [facility]:   Refer to the procedure report that was given to you for any specific questions about what was found during the examination.  If the procedure report does not answer your questions, please call your gastroenterologist to clarify.  If you requested that your care partner not be given the details of your procedure findings, then the procedure report has been included in a sealed envelope for you to review at your convenience later.  YOU SHOULD EXPECT: Some feelings of bloating in the abdomen. Passage of more gas than usual.  Walking can help get rid of the air that was put into your GI tract during the procedure and reduce the bloating. If you had a lower endoscopy (such as a colonoscopy or flexible sigmoidoscopy) you may notice spotting of blood in your stool or on the toilet paper. If you underwent a bowel prep for your procedure, you may not have a normal bowel movement for a few days.  Please Note:  You might notice some irritation and congestion in your nose or some drainage.  This is from the oxygen used during your procedure.  There is no need for concern and it should clear up in a day or so.  SYMPTOMS TO REPORT IMMEDIATELY:  Following lower endoscopy (colonoscopy or flexible sigmoidoscopy):  Excessive amounts of blood in the stool  Significant tenderness or worsening of abdominal pains  Swelling of the abdomen that is new, acute  Fever of 100F or higher  Following upper endoscopy (EGD)  Vomiting of blood or coffee ground material  New chest pain or pain under the shoulder blades  Painful or persistently difficult swallowing  New shortness of breath  Fever  of 100F or higher  Black, tarry-looking stools  For urgent or emergent issues, a gastroenterologist can be reached at any hour by calling [phone]. Do not use MyChart messaging for urgent concerns.    DIET:  We do recommend a small meal at first, but then you may proceed to your regular diet.  Drink plenty of fluids but you should avoid alcoholic beverages for 24 hours.  ACTIVITY:  You should plan to take it easy for the rest of today and you should NOT DRIVE or use heavy machinery until tomorrow (because of the sedation medicines used during the test).    FOLLOW UP: Our staff will call the number listed on your records the next business day following your procedure.  We will call around 7:15- 8:00 am to check on you and address any questions or concerns that you may have regarding the information given to you following your procedure. If we do not reach you, we will leave a message.     If any biopsies were taken you will be contacted by phone or by letter within the next 1-3 weeks.  Please call us at [phone] if you have not heard about the biopsies in 3 weeks.    SIGNATURES/CONFIDENTIALITY: You and/or your care partner have signed paperwork which will be entered into your electronic medical record.  These signatures attest to the fact that that the information above on your After Visit Summary has been reviewed and is understood.  Full responsibility of the confidentiality of  this discharge information lies with you and/or your care-partner.

## 2022-11-06 NOTE — Progress Notes (Signed)
1409 Robinul 0.1 mg IV given due large amount of secretions upon assessment.  MD made aware, vss  ?

## 2022-11-06 NOTE — Progress Notes (Signed)
GASTROENTEROLOGY PROCEDURE H&P NOTE   Primary Care Physician: Mayer Masker, PA-C (Inactive)    Reason for Procedure:   Constipation, belching, bloating, colon polyp surveillance  Plan:    EGD, colonoscopy  Patient is appropriate for endoscopic procedure(s) in the ambulatory ([facility]) setting.  The nature of the procedure, as well as the risks, benefits, and alternatives were carefully and thoroughly reviewed with the patient. Ample time for discussion and questions allowed. The patient understood, was satisfied, and agreed to proceed.     HPI: Allen Castillo is a 62 y.o. male who presents for EGD and colonoscopy for evaluation of multiple GI symptoms, to include change in bowel habits, constipation, abdominal bloating, belching, suspected GERD, along with colonoscopy for colon polyp surveillance.  Was seen in the GI clinic for this issue on 10/03/2022.  Was started on Nexium 20 mg every afternoon and MiraLAX 1 cap daily and presents today for EGD/colonoscopy.  Endoscopic History: - 11/20/2017: Colonoscopy [facility]): 5 mm sigmoid polyp (path: Unremarkable colonic mucosa), left-sided diverticulosis, grade 1 internal hemorrhoids.  Repeat in 5 years  Past Medical History:  Diagnosis Date   Asthma    Depression    Glaucoma     Past Surgical History:  Procedure Laterality Date   SPINE SURGERY      Prior to Admission medications   Medication Sig Start Date End Date Taking? Authorizing Provider  esomeprazole (NEXIUM) 20 MG capsule Take 1 capsule (20 mg total) by mouth at bedtime. 10/03/22  Yes Brailey Buescher V, DO  latanoprost (XALATAN) 0.005 % ophthalmic solution Place 1 drop into both eyes daily. 10/26/18  Yes [provider]  Omega-3 Fatty Acids (FISH OIL BURP-LESS PO) Take 1 capsule by mouth daily.   Yes [provider]  saw palmetto 500 MG capsule Take 500 mg by mouth daily.   Yes [provider]  timolol (TIMOPTIC) 0.5 % ophthalmic  solution  01/15/19  Yes [provider]  albuterol (VENTOLIN HFA) 108 (90 Base) MCG/ACT inhaler Inhale 1-2 puffs into the lungs every 6 (six) hours as needed for wheezing or shortness of breath. Patient not taking: Reported on 11/06/2022 10/15/22   Garrison, [place] N, FNP  baclofen (LIORESAL) 10 MG tablet TAKE 1 TABLET BY MOUTH 2 TIMES DAILY AS NEEDED FOR MUSCLE SPASMS. Patient not taking: Reported on 11/06/2022 06/25/20   Mayer Masker, PA-C  fluticasone [facility]) 50 MCG/ACT nasal spray 1 spray each nostril following sinus rinses twice daily 10/04/19   Opalski, Gavin Pound, DO  levocetirizine (XYZAL) 5 MG tablet Take 1 tablet (5 mg total) by mouth every evening. Patient not taking: Reported on 11/06/2022 10/04/19   Thomasene Lot, DO  metroNIDAZOLE (METROGEL) 0.75 % gel Apply 1 application topically 2 (two) times daily. 10/27/18   Danford, Orpha Bur D, NP  montelukast (SINGULAIR) 10 MG tablet Take 1 tablet (10 mg total) by mouth at bedtime. 10/04/19   Opalski, Gavin Pound, DO  Olopatadine HCl (PATADAY) 0.2 % SOLN 1 drop each eye daily for itch/ watery eyes due to allergies Patient not taking: Reported on 11/06/2022 10/04/19   Thomasene Lot, DO  tretinoin (RETIN-A) 0.025 % cream Apply 1 application  topically at bedtime. Patient not taking: Reported on 11/06/2022 08/22/19   [provider]  triamcinolone cream (KENALOG) 0.1 % SMARTSIG:1 Application Topical 2-3 Times Daily Patient not taking: Reported on 11/06/2022 08/22/19   [provider]    Current Outpatient Medications  Medication Sig Dispense Refill   esomeprazole (NEXIUM) 20 MG capsule Take  1 capsule (20 mg total) by mouth at bedtime. 90 capsule 1   latanoprost (XALATAN) 0.005 % ophthalmic solution Place 1 drop into both eyes daily.     Omega-3 Fatty Acids (FISH OIL BURP-LESS PO) Take 1 capsule by mouth daily.     saw palmetto 500 MG capsule Take 500 mg by mouth daily.     timolol (TIMOPTIC) 0.5 % ophthalmic solution      albuterol  (VENTOLIN HFA) 108 (90 Base) MCG/ACT inhaler Inhale 1-2 puffs into the lungs every 6 (six) hours as needed for wheezing or shortness of breath. (Patient not taking: Reported on 11/06/2022) 18 g 0   baclofen (LIORESAL) 10 MG tablet TAKE 1 TABLET BY MOUTH 2 TIMES DAILY AS NEEDED FOR MUSCLE SPASMS. (Patient not taking: Reported on 11/06/2022) 60 tablet 0   fluticasone (FLONASE) 50 MCG/ACT nasal spray 1 spray each nostril following sinus rinses twice daily 16 g 2   levocetirizine (XYZAL) 5 MG tablet Take 1 tablet (5 mg total) by mouth every evening. (Patient not taking: Reported on 11/06/2022) 90 tablet 0   metroNIDAZOLE (METROGEL) 0.75 % gel Apply 1 application topically 2 (two) times daily. 45 g 2   montelukast (SINGULAIR) 10 MG tablet Take 1 tablet (10 mg total) by mouth at bedtime. 90 tablet 0   Olopatadine HCl (PATADAY) 0.2 % SOLN 1 drop each eye daily for itch/ watery eyes due to allergies (Patient not taking: Reported on 11/06/2022) 10 mL 0   tretinoin (RETIN-A) 0.025 % cream Apply 1 application  topically at bedtime. (Patient not taking: Reported on 11/06/2022)     triamcinolone cream (KENALOG) 0.1 % SMARTSIG:1 Application Topical 2-3 Times Daily (Patient not taking: Reported on 11/06/2022)     Current Facility-Administered Medications  Medication Dose Route Frequency Provider Last Rate Last Admin   0.9 %  sodium chloride infusion  500 mL Intravenous Once Emmajo Bennette V, DO        Allergies as of 11/06/2022   (No Known Allergies)    Family History  Problem Relation Age of Onset   Healthy Mother    Healthy Father    Healthy Brother    Liver disease Neg Hx    Esophageal cancer Neg Hx    Colon cancer Neg Hx     Social History   Socioeconomic History   Marital status: Married    Spouse name: Not on file   Number of children: 3   Years of education: Not on file   Highest education level: Not on file  Occupational History   Occupation: Warehouse assocaite  Tobacco Use   Smoking  status: Never   Smokeless tobacco: Never  Vaping Use   Vaping Use: Never used  Substance and Sexual Activity   Alcohol use: Never   Drug use: Never   Sexual activity: Yes  Other Topics Concern   Not on file  Social History Narrative   Not on file   Social Determinants of Health   Financial Resource Strain: Not on file  Food Insecurity: Not on file  Transportation Needs: Not on file  Physical Activity: Not on file  Stress: Not on file  Social Connections: Not on file  Intimate Partner Violence: Not on file    Physical Exam: Vital signs in last 24 hours: @BP  139/84   Pulse 80   Temp 98.9 F (37.2 C)   Ht 5\' 11"  (1.803 m)   Wt 195 lb (88.5 kg)   SpO2 97%   BMI 27.20 kg/m  GEN: NAD EYE: Sclerae anicteric ENT: MMM CV: Non-tachycardic Pulm: CTA b/l GI: Soft, NT/ND NEURO:  Alert & Oriented x 3   Doristine Locks, DO [facility]   11/06/2022 2:04 PM

## 2022-11-07 ENCOUNTER — Telehealth: Payer: Self-pay

## 2022-11-07 NOTE — Telephone Encounter (Signed)
Attempted to reach pt. With follow-up call following endoscopic procedure 11/06/2022.  LM on pt. Voice mail to call if pt. Has any questions or concerns.

## 2023-10-02 ENCOUNTER — Encounter: Payer: Self-pay | Admitting: Family Medicine

## 2023-10-02 ENCOUNTER — Ambulatory Visit

## 2023-10-02 ENCOUNTER — Ambulatory Visit: Admission: EM | Admit: 2023-10-02 | Discharge: 2023-10-02 | Disposition: A | Discharge status: Home / Self Care

## 2023-10-02 DIAGNOSIS — J45901 Unspecified asthma with (acute) exacerbation: Secondary | ICD-10-CM

## 2023-10-02 DIAGNOSIS — R062 Wheezing: Secondary | ICD-10-CM

## 2023-10-02 MED ORDER — PREDNISONE 50 MG PO TABS
50.0000 mg | ORAL_TABLET | ORAL | Status: AC
  Administered 2023-10-02: 50 mg via ORAL

## 2023-10-02 MED ORDER — ALBUTEROL SULFATE HFA 108 (90 BASE) MCG/ACT IN AERS: 2.0000 | INHALATION_SPRAY | RESPIRATORY_TRACT | 0 refills | Status: AC | PRN

## 2023-10-02 MED ORDER — IPRATROPIUM-ALBUTEROL 0.5-2.5 (3) MG/3ML IN SOLN
3.0000 mL | Freq: Once | RESPIRATORY_TRACT | Status: AC
  Administered 2023-10-02: 3 mL via RESPIRATORY_TRACT

## 2023-10-02 MED ORDER — AEROCHAMBER PLUS FLO-VU MEDIUM MISC
1.0000 | Freq: Once | Status: AC
  Administered 2023-10-02: 1

## 2023-10-02 MED ORDER — ALBUTEROL SULFATE HFA 108 (90 BASE) MCG/ACT IN AERS
2.0000 | INHALATION_SPRAY | Freq: Once | RESPIRATORY_TRACT | Status: AC
  Administered 2023-10-02: 2 via RESPIRATORY_TRACT

## 2023-10-02 MED ORDER — PREDNISONE 20 MG PO TABS: 20.0000 mg | ORAL_TABLET | Freq: Two times a day (BID) | ORAL | 0 refills | Status: AC

## 2023-10-02 NOTE — Discharge Instructions (Addendum)
 Chest x-ray viewed no obvious signs of pneumonia.  Radiology will overread the x-ray if there is any changes we will notify you by phone. Current symptoms are related to reactive airway disease exacerbation.  Start prednisone 20 mg twice daily  for 5 days. Continue butyryl 2 puffs every 4-6 hours as needed for shortness of breath or wheezing.

## 2023-10-02 NOTE — ED Provider Notes (Signed)
 [facility]    CSN: [id number] Arrival date & time: 10/02/23  1734      History   Chief Complaint Chief Complaint  Patient presents with   Asthma Exacerbation    HPI [name] is a 63 y.o. male.   HPI With a history of asthma and seasonal allergies presents today with weeks of shortness of breath, cough and wheezing.  Has been using albuterol inhaler continuously with minimal improvement of breathing.  Has any chills or fever.  He reports this often happens in the springtime secondary to pollen. Patient endorses asthma typically controlled until spring. He has used albuterol inhaler several times over the last few weeks, without relief of shortness of breath or wheezing. Patient is a non smoker. Past Medical History:  Diagnosis Date   Asthma    Depression    Glaucoma     Patient Active Problem List   Diagnosis Date Noted   Plantar fasciitis 05/15/2019   Elevated BP without diagnosis of hypertension 03/22/2019   Elevated LDL cholesterol level 02/22/2019   Rosacea 10/27/2018   Glaucoma 10/27/2018   Healthcare maintenance 10/27/2018   Metatarsalgia of right foot 09/28/2016   Toe injury, right, sequela 09/28/2016    Past Surgical History:  Procedure Laterality Date   SPINE SURGERY         Home Medications    Prior to Admission medications   Medication Sig Start Date End Date Taking? Authorizing Provider  albuterol (VENTOLIN HFA) 108 (90 Base) MCG/ACT inhaler Inhale 2 puffs into the lungs every 4 (four) hours as needed for wheezing or shortness of breath. 10/02/23  Yes Buena Carmine, NP  esomeprazole (NEXIUM) 20 MG capsule Take 1 capsule (20 mg total) by mouth at bedtime. 10/03/22  Yes Cirigliano, Vito V, DO  fluticasone (FLONASE) 50 MCG/ACT nasal spray 1 spray each nostril following sinus rinses twice daily 10/04/19  Yes Opalski, Deborah, DO  latanoprost (XALATAN) 0.005 % ophthalmic solution Place 1 drop into both eyes daily. 10/26/18  Yes  [provider]  Olopatadine HCl (PATADAY) 0.2 % SOLN 1 drop each eye daily for itch/ watery eyes due to allergies 10/04/19  Yes Opalski, Bernardo Bridgeman, DO  Omega-3 Fatty Acids (FISH OIL BURP-LESS PO) Take 1 capsule by mouth daily.   Yes [provider]  predniSONE (DELTASONE) 20 MG tablet Take 1 tablet (20 mg total) by mouth 2 (two) times daily with a meal for 5 days. 10/02/23 10/07/23 Yes Buena Carmine, NP  saw palmetto 500 MG capsule Take 500 mg by mouth daily.   Yes [provider]  VTAMA 1 % CREA SMARTSIG:1 Topical Daily 05/12/23  Yes [provider]  baclofen (LIORESAL) 10 MG tablet TAKE 1 TABLET BY MOUTH 2 TIMES DAILY AS NEEDED FOR MUSCLE SPASMS. Patient not taking: Reported on 11/06/2022 06/25/20   Abonza, Maritza, PA-C  levocetirizine (XYZAL) 5 MG tablet Take 1 tablet (5 mg total) by mouth every evening. Patient not taking: Reported on 11/06/2022 10/04/19   Marceil Sensor, DO  meloxicam (MOBIC) 15 MG tablet Take 15 mg by mouth daily. 11/06/16   [provider]  meloxicam (MOBIC) 7.5 MG tablet Take 7.5 mg by mouth daily. 09/28/16   [provider]  metroNIDAZOLE (METROGEL) 0.75 % gel Apply 1 application topically 2 (two) times daily. 10/27/18   Danford, Acie Holiday D, NP  montelukast (SINGULAIR) 10 MG tablet Take 1 tablet (10 mg total) by mouth at bedtime. 10/04/19   Opalski, Deborah, DO  SOOLANTRA 1 %  CREA Apply 1 Application topically daily. 05/12/23   [provider]  timolol (TIMOPTIC) 0.5 % ophthalmic solution  01/15/19   [provider]  tretinoin (RETIN-A) 0.025 % cream Apply 1 application  topically at bedtime. Patient not taking: Reported on 11/06/2022 08/22/19   [provider]  triamcinolone cream (KENALOG) 0.1 % SMARTSIG:1 Application Topical 2-3 Times Daily Patient not taking: Reported on 11/06/2022 08/22/19   [provider]    Family History Family History  Problem Relation Age of Onset   Healthy Mother     Healthy Father    Healthy Brother    Liver disease Neg Hx    Esophageal cancer Neg Hx    Colon cancer Neg Hx     Social History Social History   Tobacco Use   Smoking status: Never    Passive exposure: Never   Smokeless tobacco: Never  Vaping Use   Vaping status: Never Used  Substance Use Topics   Alcohol use: Not Currently   Drug use: Not Currently     Allergies   Patient has no known allergies.   Review of Systems Review of Systems  HENT:  Positive for congestion.   Respiratory:  Positive for cough and shortness of breath.    Physical Exam Triage Vital Signs ED Triage Vitals  Encounter Vitals Group     BP 10/02/23 1801 130/80     Systolic BP Percentile --      Diastolic BP Percentile --      Pulse Rate 10/02/23 1801 70     Resp 10/02/23 1801 (!) 22     Temp 10/02/23 1801 98.2 F (36.8 C)     Temp Source 10/02/23 1801 Oral     SpO2 10/02/23 1801 92 %     Weight 10/02/23 1756 188 lb (85.3 kg)     Height 10/02/23 1756 5\' 11"  (1.803 m)     Head Circumference --      Peak Flow --      Pain Score 10/02/23 1754 0     Pain Loc --      Pain Education --      Exclude from Growth Chart --    No data found.  Updated Vital Signs BP 130/80 (BP Location: Left Arm)   Pulse 69   Temp 98.2 F (36.8 C) (Oral)   Resp 20   Ht 5\' 11"  (1.803 m)   Wt 188 lb (85.3 kg)   SpO2 96%   BMI 26.22 kg/m   Visual Acuity Right Eye Distance:   Left Eye Distance:   Bilateral Distance:    Right Eye Near:   Left Eye Near:    Bilateral Near:     Physical Exam Constitutional:      General: He is awake.     Appearance: Normal appearance. He is ill-appearing and diaphoretic.  HENT:     Head: Normocephalic and atraumatic.     Right Ear: External ear normal.     Left Ear: External ear normal.     Nose: Congestion and rhinorrhea present.     Mouth/Throat:     Pharynx: No oropharyngeal exudate or posterior oropharyngeal erythema.  Eyes:     Extraocular Movements:  Extraocular movements intact.     Pupils: Pupils are equal, round, and reactive to light.  Cardiovascular:     Rate and Rhythm: Normal rate and regular rhythm.  Pulmonary:     Effort: Pulmonary effort is normal.     Breath sounds: Decreased  air movement present. Wheezing present.  Musculoskeletal:        General: Normal range of motion.     Cervical back: Normal range of motion and neck supple.  Skin:    General: Skin is warm.     Capillary Refill: Capillary refill takes less than 2 seconds.  Neurological:     General: No focal deficit present.     Mental Status: He is alert and oriented to person, place, and time.  Psychiatric:        Behavior: Behavior is cooperative.      UC Treatments / Results  Labs (all labs ordered are listed, but only abnormal results are displayed) Labs Reviewed - No data to display  EKG   Radiology DG Chest 2 View Result Date: 10/02/2023 CLINICAL DATA:  Dyspnea, asthma EXAM: CHEST - 2 VIEW COMPARISON:  None Available. FINDINGS: The heart size and mediastinal contours are within normal limits. Both lungs are clear. The visualized skeletal structures are unremarkable. IMPRESSION: No active cardiopulmonary disease. Electronically Signed   By: Worthy Heads M.D.   On: 10/02/2023 20:32    Procedures Procedures (including critical care time)  Medications Ordered in UC Medications  ipratropium-albuterol (DUONEB) 0.5-2.5 (3) MG/3ML nebulizer solution 3 mL (3 mLs Nebulization Given 10/02/23 1928)  albuterol (VENTOLIN HFA) 108 (90 Base) MCG/ACT inhaler 2 puff (2 puffs Inhalation Given 10/02/23 1955)  predniSONE (DELTASONE) tablet 50 mg (50 mg Oral Given 10/02/23 1953)  AeroChamber Plus Flo-Vu Medium MISC 1 each (1 each Other Given 10/02/23 1955)    Initial Impression / Assessment and Plan / UC Course  I have reviewed the triage vital signs and the nursing notes.  Pertinent labs & imaging results that were available during my care of the patient were  reviewed by me and considered in my medical decision making (see chart for details).    Reactive Airway Disease with Wheezing  Treatment with Prednisone 20 mg BID x 5 days. Albuterol 2  puffs every 4-6 hours as needed shortness of breath and wheezing. Chest x-ray negative. Return precautions given  if symptoms worsen or do not improve. Final Clinical Impressions(s) / UC Diagnoses   Final diagnoses:  Reactive airway disease with acute exacerbation, unspecified asthma severity, unspecified whether persistent  Wheezing     Discharge Instructions      Chest x-ray viewed no obvious signs of pneumonia.  Radiology will overread the x-ray if there is any changes we will notify you by phone. Current symptoms are related to reactive airway disease exacerbation.  Start prednisone 20 mg twice daily  for 5 days. Continue butyryl 2 puffs every 4-6 hours as needed for shortness of breath or wheezing.      ED Prescriptions     Medication Sig Dispense Auth. Provider   predniSONE (DELTASONE) 20 MG tablet Take 1 tablet (20 mg total) by mouth 2 (two) times daily with a meal for 5 days. 10 tablet Buena Carmine, NP   albuterol (VENTOLIN HFA) 108 (90 Base) MCG/ACT inhaler Inhale 2 puffs into the lungs every 4 (four) hours as needed for wheezing or shortness of breath. 18 g Buena Carmine, NP      PDMP not reviewed this encounter.   Buena Carmine, NP 10/04/23 [phone]

## 2023-10-02 NOTE — ED Triage Notes (Signed)
"  This started as soon as the pollen started (a few wks now), lots of cough, wheezing at times, itchy/watery eyes". Followed by [facility]).
